# Patient Record
Sex: Female | Born: 1970 | Race: White | Hispanic: No | Marital: Married | Smoking: Never smoker
Health system: Southern US, Community
[De-identification: ages and names within clinical notes are randomized; demographics above are authoritative.]

## PROBLEM LIST (undated history)

## (undated) DIAGNOSIS — N390 Urinary tract infection, site not specified: Secondary | ICD-10-CM

## (undated) DIAGNOSIS — F410 Panic disorder [episodic paroxysmal anxiety] without agoraphobia: Secondary | ICD-10-CM

## (undated) DIAGNOSIS — B019 Varicella without complication: Secondary | ICD-10-CM

## (undated) DIAGNOSIS — K5900 Constipation, unspecified: Secondary | ICD-10-CM

## (undated) DIAGNOSIS — J309 Allergic rhinitis, unspecified: Secondary | ICD-10-CM

## (undated) DIAGNOSIS — N200 Calculus of kidney: Secondary | ICD-10-CM

## (undated) HISTORY — PX: LITHOTRIPSY: SUR834

## (undated) HISTORY — DX: Calculus of kidney: N20.0

## (undated) HISTORY — DX: Varicella without complication: B01.9

## (undated) HISTORY — DX: Panic disorder (episodic paroxysmal anxiety): F41.0

## (undated) HISTORY — DX: Prediabetes: R73.03

## (undated) HISTORY — DX: Urinary tract infection, site not specified: N39.0

## (undated) HISTORY — DX: Constipation, unspecified: K59.00

## (undated) HISTORY — DX: Allergic rhinitis, unspecified: J30.9

---

## 1994-09-20 HISTORY — PX: CHOLECYSTECTOMY: SHX55

## 2014-05-09 ENCOUNTER — Other Ambulatory Visit: Payer: Self-pay | Admitting: Obstetrics and Gynecology

## 2014-05-09 DIAGNOSIS — R928 Other abnormal and inconclusive findings on diagnostic imaging of breast: Secondary | ICD-10-CM

## 2016-02-24 ENCOUNTER — Other Ambulatory Visit: Payer: Self-pay | Admitting: Family Medicine

## 2016-02-24 ENCOUNTER — Ambulatory Visit
Admission: RE | Admit: 2016-02-24 | Discharge: 2016-02-24 | Disposition: A | Discharge status: Home / Self Care | Payer: Self-pay | Source: Ambulatory Visit | Attending: Family Medicine | Admitting: Family Medicine

## 2016-02-24 DIAGNOSIS — R109 Unspecified abdominal pain: Secondary | ICD-10-CM

## 2016-02-24 DIAGNOSIS — R10A0 Flank pain, unspecified side: Secondary | ICD-10-CM

## 2016-06-30 ENCOUNTER — Other Ambulatory Visit: Payer: Self-pay | Admitting: Orthopedic Surgery

## 2016-06-30 DIAGNOSIS — M542 Cervicalgia: Secondary | ICD-10-CM

## 2016-07-10 ENCOUNTER — Ambulatory Visit
Admission: RE | Admit: 2016-07-10 | Discharge: 2016-07-10 | Disposition: A | Discharge status: Home / Self Care | Payer: Managed Care, Other (non HMO) | Source: Ambulatory Visit | Attending: Orthopedic Surgery | Admitting: Orthopedic Surgery

## 2016-07-10 DIAGNOSIS — M542 Cervicalgia: Secondary | ICD-10-CM

## 2016-07-28 ENCOUNTER — Other Ambulatory Visit: Payer: Self-pay | Admitting: Neurosurgery

## 2016-07-28 DIAGNOSIS — M5412 Radiculopathy, cervical region: Secondary | ICD-10-CM

## 2016-08-06 ENCOUNTER — Ambulatory Visit
Admission: RE | Admit: 2016-08-06 | Discharge: 2016-08-06 | Disposition: A | Discharge status: Home / Self Care | Payer: Managed Care, Other (non HMO) | Source: Ambulatory Visit | Attending: Neurosurgery | Admitting: Neurosurgery

## 2016-08-06 ENCOUNTER — Encounter: Payer: Self-pay | Admitting: Radiology

## 2016-08-06 DIAGNOSIS — M5412 Radiculopathy, cervical region: Secondary | ICD-10-CM

## 2016-08-06 MED ORDER — IOPAMIDOL (ISOVUE-M 300) INJECTION 61%
10.0000 mL | Freq: Once | INTRAMUSCULAR | Status: AC | PRN
  Administered 2016-08-06: 10 mL via INTRATHECAL

## 2016-08-06 MED ORDER — DIPHENHYDRAMINE HCL 50 MG PO CAPS
50.0000 mg | ORAL_CAPSULE | Freq: Once | ORAL | Status: AC
  Administered 2016-08-06: 50 mg via ORAL

## 2016-08-06 MED ORDER — ONDANSETRON HCL 4 MG/2ML IJ SOLN
4.0000 mg | Freq: Once | INTRAMUSCULAR | Status: AC
  Administered 2016-08-06: 4 mg via INTRAMUSCULAR

## 2016-08-06 MED ORDER — ONDANSETRON HCL 4 MG/2ML IJ SOLN: 4.0000 mg | Freq: Four times a day (QID) | INTRAMUSCULAR | Status: DC | PRN

## 2016-08-06 MED ORDER — MEPERIDINE HCL 100 MG/ML IJ SOLN
75.0000 mg | Freq: Once | INTRAMUSCULAR | Status: AC
  Administered 2016-08-06: 75 mg via INTRAMUSCULAR

## 2016-08-06 MED ORDER — DIAZEPAM 5 MG PO TABS
10.0000 mg | ORAL_TABLET | Freq: Once | ORAL | Status: AC
  Administered 2016-08-06: 10 mg via ORAL

## 2016-08-06 NOTE — Progress Notes (Signed)
Patient states she has been off Tramadol for at least the past two days.  jkl 

## 2016-08-06 NOTE — Discharge Instructions (Signed)
Myelogram Discharge Instructions  1. Go home and rest quietly for the next 24 hours.  It is important to lie flat for the next 24 hours.  Get up only to go to the restroom.  You may lie in the bed or on a couch on your back, your stomach, your left side or your right side.  You may have one pillow under your head.  You may have pillows between your knees while you are on your side or under your knees while you are on your back.  2. DO NOT drive today.  Recline the seat as far back as it will go, while still wearing your seat belt, on the way home.  3. You may get up to go to the bathroom as needed.  You may sit up for 10 minutes to eat.  You may resume your normal diet and medications unless otherwise indicated.  Drink plenty of extra fluids today and tomorrow.  4. The incidence of a spinal headache with nausea and/or vomiting is about 5% (one in 20 patients).  If you develop a headache, lie flat and drink plenty of fluids until the headache goes away.  Caffeinated beverages may be helpful.  If you develop severe nausea and vomiting or a headache that does not go away with flat bed rest, call [phone].  5. You may resume normal activities after your 24 hours of bed rest is over; however, do not exert yourself strongly or do any heavy lifting tomorrow.  6. Call your physician for a follow-up appointment.   You may resume Tramadol on Saturday,  August 07, 2016 after 10:30a.m.

## 2016-08-09 ENCOUNTER — Telehealth: Payer: Self-pay | Admitting: Radiology

## 2016-08-09 NOTE — Telephone Encounter (Signed)
Pt has flare up of her pain. Explained this was a side effect of the myelogram. Denies headache and will f/u with Dr. Jordan LikesPool in the am.

## 2016-08-18 ENCOUNTER — Other Ambulatory Visit (HOSPITAL_COMMUNITY): Payer: Self-pay | Admitting: Neurosurgery

## 2016-08-18 DIAGNOSIS — M5412 Radiculopathy, cervical region: Secondary | ICD-10-CM

## 2016-08-24 ENCOUNTER — Encounter (HOSPITAL_COMMUNITY)
Admission: RE | Admit: 2016-08-24 | Discharge: 2016-08-24 | Disposition: A | Discharge status: Home / Self Care | Payer: Managed Care, Other (non HMO) | Source: Ambulatory Visit | Attending: Neurosurgery | Admitting: Neurosurgery

## 2016-08-24 DIAGNOSIS — M5412 Radiculopathy, cervical region: Secondary | ICD-10-CM | POA: Insufficient documentation

## 2016-08-24 MED ORDER — TECHNETIUM TC 99M MEDRONATE IV KIT
21.2000 | PACK | Freq: Once | INTRAVENOUS | Status: AC | PRN
  Administered 2016-08-24: 21.2 via INTRAVENOUS

## 2018-05-17 IMAGING — CT CT CERVICAL SPINE W/ CM
2 series · 10 of 14 positions shown, 12 images · non-contrast
Comparison: Cervical spine MRI 07/10/2016

CLINICAL DATA: Neck and left upper extremity pain.
TECHNIQUE: Contiguous axial images were obtained through the Cervical spine
after the intrathecal infusion of infusion. Coronal and sagittal
reconstructions were obtained of the axial image sets.

[Series 2: cspine soft · axial · 0.33mm/px · z∈[-635,-507]mm · 5 of 97 slices shown]
[im 17/97  soft-tissue]
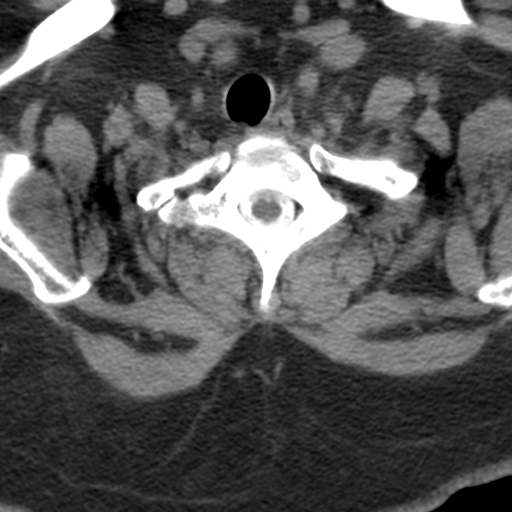
[im 33/97  soft-tissue]
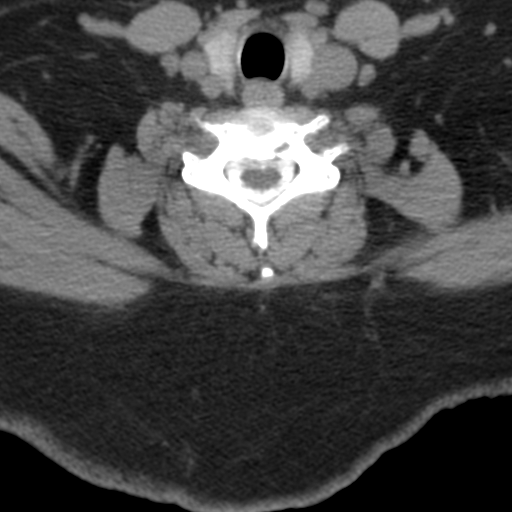
[im 49/97  soft-tissue]
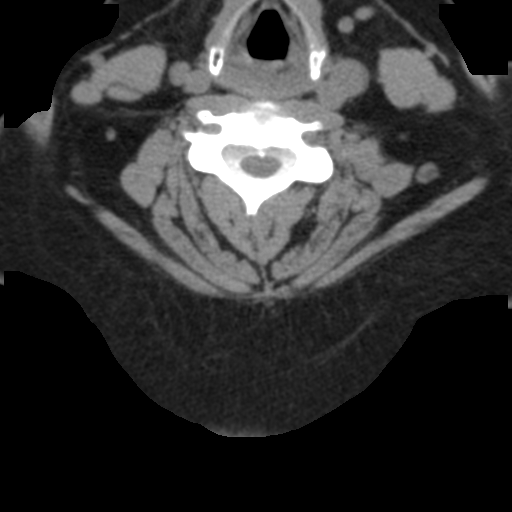
[im 65/97  soft-tissue]
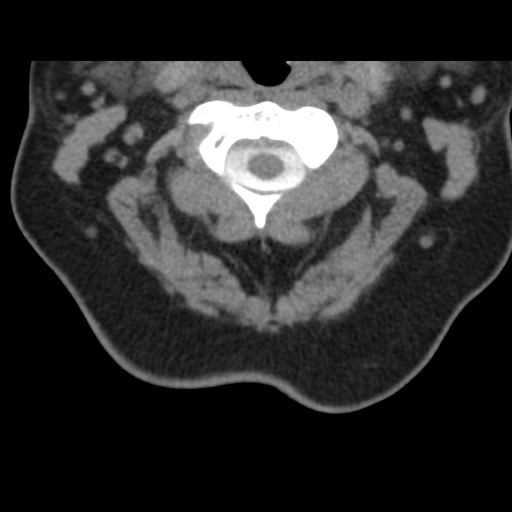
[im 81/97  soft-tissue]
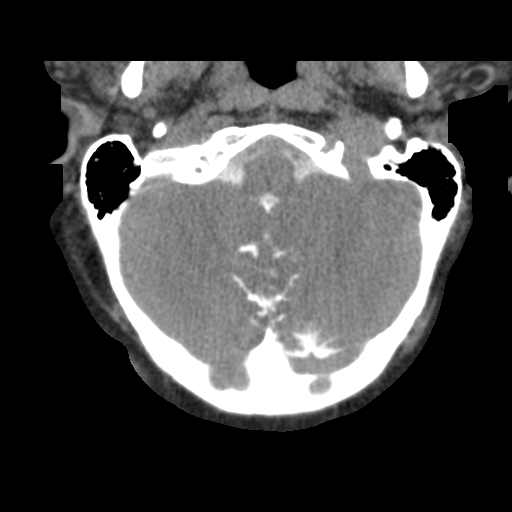

[Series 8: angled axial · axial · 0.29mm/px · z∈[-651,-527]mm · 5 of 97 slices shown, 7 images]
[im 17/97  soft-tissue]
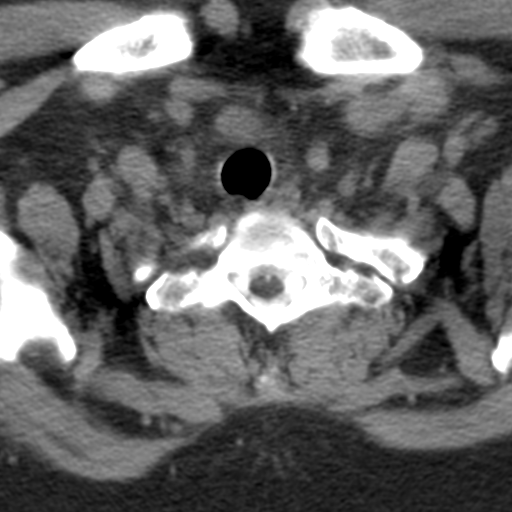
[im 17/97  bone]
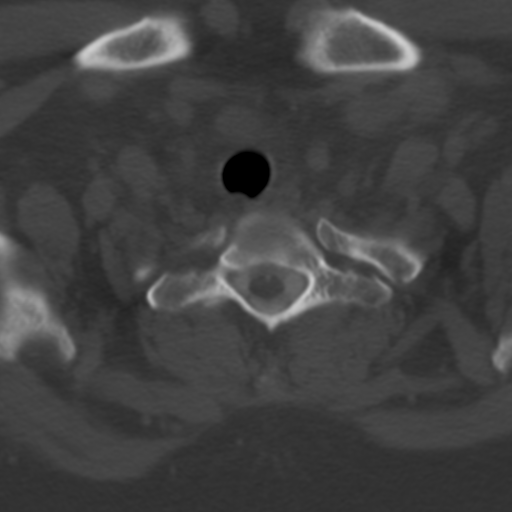
[im 33/97  bone]
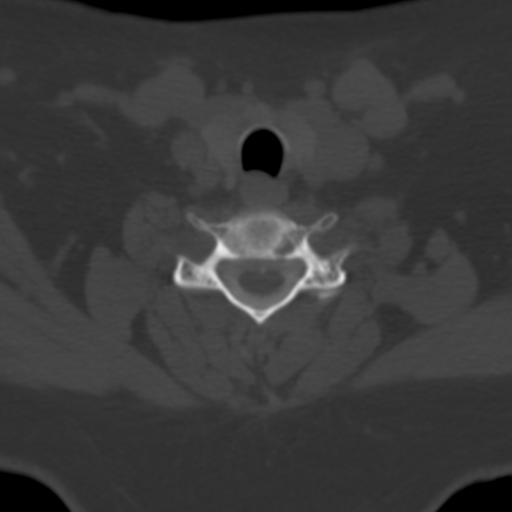
[im 49/97  bone]
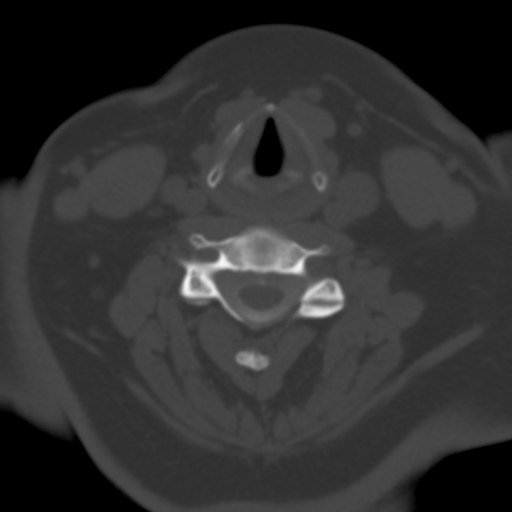
[im 65/97  bone]
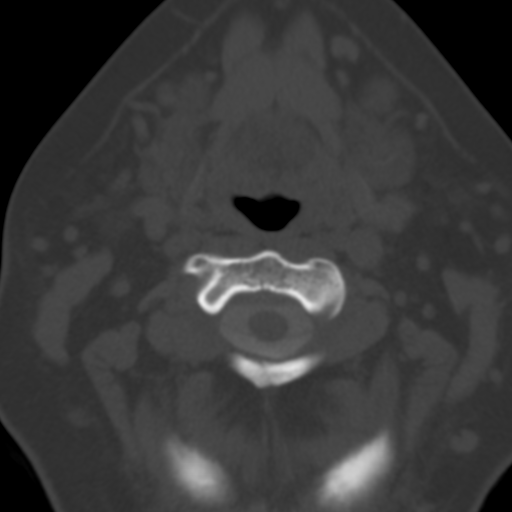
[im 81/97  soft-tissue]
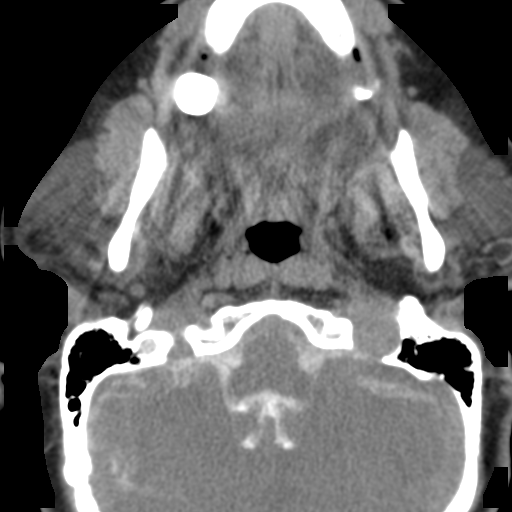
[im 81/97  bone]
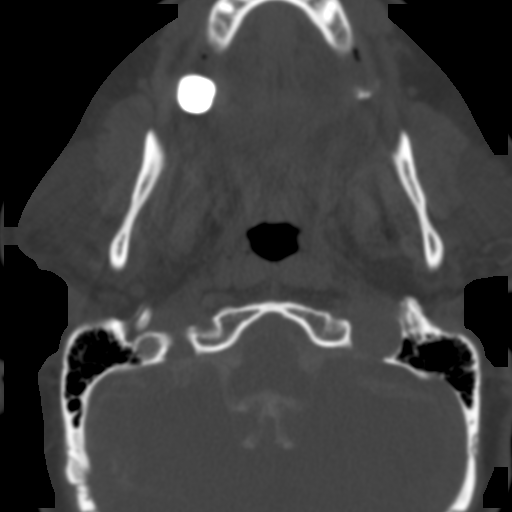

[10 of 14 positions shown; findings below may reference images not displayed]

FLUOROSCOPY TIME:  Radiation Exposure Index (as provided by the
fluoroscopic device): 233.05 microGray*m^2

Fluoroscopy Time (in minutes and seconds):  40 seconds

PROCEDURE:
LUMBAR PUNCTURE FOR CERVICAL MYELOGRAM

After thorough discussion of risks and benefits of the procedure
including bleeding, infection, injury to nerves, blood vessels,
adjacent structures as well as headache and CSF leak, written and
oral informed consent was obtained. Consent was obtained by Dr.
Loela Alazzawi. We discussed the high likelihood of obtaining a
diagnostic study.

Patient was positioned prone on the fluoroscopy table. Local
anesthesia was provided with 1% lidocaine without epinephrine after
prepped and draped in the usual sterile fashion. Puncture was
performed at L3-4 using a 5 inch 22-gauge spinal needle via a left
interlaminar approach. Using a single pass through the dura, the
needle was placed within the thecal sac, with return of clear CSF.
10 mL of Isovue M 300 was injected into the thecal sac, with normal
opacification of the nerve roots and cauda equina consistent with
free flow within the subarachnoid space. The patient was then moved
to the trendelenburg position and contrast flowed into the Cervical
spine region.

I personally performed the lumbar puncture and administered the
intrathecal contrast. I also personally supervised acquisition of
the myelogram images.
FINDINGS: CERVICAL MYELOGRAM FINDINGS:

Vertebral alignment is normal. There is no evidence of dynamic
instability on upright flexion or extension images. The thecal sac
appears widely patent, and no asymmetric nerve root effacement is
seen.

CT CERVICAL MYELOGRAM FINDINGS:

Vertebral alignment is normal. There is no evidence of fracture.
There is a sharply marginated 8 mm lucent focus in the posterior C6
vertebral body just left of midline which is overall benign in
appearance, possibly venous. There is also a somewhat trabeculated,
lucent appearance of the left C5 and C6 articular pillars without
cortical destruction, facet joint space narrowing, or degenerative
spurring. The cervical spinal cord is normal in caliber. The
paraspinal soft tissues are unremarkable.

Intervertebral disc space heights are preserved throughout the
cervical spine. No disc herniation is identified. The spinal canal
and neural foramina are widely patent.
IMPRESSION: 1. No cause of neck pain or cervical radiculopathy identified. No
disc herniation or stenosis.
2. Small lucencies in the C6 vertebral body and left C5 and C6
articular pillars, indeterminate but overall benign in appearance,
possibly vascular. No fracture.

## 2018-08-11 ENCOUNTER — Ambulatory Visit: Payer: Managed Care, Other (non HMO) | Admitting: Primary Care

## 2018-08-11 ENCOUNTER — Encounter (INDEPENDENT_AMBULATORY_CARE_PROVIDER_SITE_OTHER): Payer: Self-pay

## 2018-08-11 ENCOUNTER — Telehealth: Payer: Self-pay | Admitting: Primary Care

## 2018-08-11 ENCOUNTER — Encounter: Payer: Self-pay | Admitting: Primary Care

## 2018-08-11 VITALS — BP 124/80 | HR 87 | Temp 98.3°F | Ht 63.5 in | Wt 203.5 lb

## 2018-08-11 DIAGNOSIS — M5442 Lumbago with sciatica, left side: Principal | ICD-10-CM

## 2018-08-11 DIAGNOSIS — M549 Dorsalgia, unspecified: Secondary | ICD-10-CM

## 2018-08-11 DIAGNOSIS — E669 Obesity, unspecified: Secondary | ICD-10-CM | POA: Insufficient documentation

## 2018-08-11 DIAGNOSIS — E66813 Obesity, class 3: Secondary | ICD-10-CM | POA: Insufficient documentation

## 2018-08-11 DIAGNOSIS — G8929 Other chronic pain: Secondary | ICD-10-CM | POA: Insufficient documentation

## 2018-08-11 NOTE — Progress Notes (Signed)
Subjective:    Patient ID: Jill Meyers, female    DOB: November 10, 1970, 47 y.o.   MRN: [id number]  HPI  Jill Meyers is a 47 year old female who presents today to establish care and discuss the problems mentioned below. Will obtain old records. Her last physical was in Summer 2018, mammogram was in August 2019.  She underwent some lab work for a biometric screening at her occupation in October.  She is not sure of the results.  1) Left Upper and Lower Extremity Pain: Occurred to the left upper extremity several years ago after a manipulation to the neck from a chiropractor. She followed with neurosurgery who treated her with medication and physical therapy. Since then her symptoms of upper extremity pain have resolved.  She then noticed left lower extremity pain with radiation down to her mid lateral thigh that has been present for the last 8 month, also with numbness. She does have intermittent lower back pain. She was told by a chiropractor that her vertebra in the lower spine was compressed which is pinching a nerve. She has since been stretching, doing yoga, wearing more comfortable shoes without much improvement.  She's not seen a physical therapist as she cannot afford the co-pay. She's never undergone steroid injections to her lumbar spine.  She does have contact with an orthopedist who is treating her husband.  She plans on calling to schedule an appointment.   Review of Systems  Respiratory: Negative for shortness of breath.   Cardiovascular: Negative for chest pain.  Genitourinary:       Denies changes in bowel or bladder control.  Musculoskeletal: Positive for back pain.       Left lower extremity pain  Skin: Negative for color change.  Neurological: Positive for numbness. Negative for weakness.       Past Medical History:  Diagnosis Date  . Chickenpox   . Kidney stones   . UTI (urinary tract infection)      Social History   Socioeconomic History  . Marital status:  Married    Spouse name: Not on file  . Number of children: Not on file  . Years of education: Not on file  . Highest education level: Not on file  Occupational History  . Not on file  Social Needs  . Financial resource strain: Not on file  . Food insecurity:    Worry: Not on file    Inability: Not on file  . Transportation needs:    Medical: Not on file    Non-medical: Not on file  Tobacco Use  . Smoking status: Never Smoker  . Smokeless tobacco: Never Used  Substance and Sexual Activity  . Alcohol use: Not Currently  . Drug use: Not on file  . Sexual activity: Not on file  Lifestyle  . Physical activity:    Days per week: Not on file    Minutes per session: Not on file  . Stress: Not on file  Relationships  . Social connections:    Talks on phone: Not on file    Gets together: Not on file    Attends religious service: Not on file    Active member of club or organization: Not on file    Attends meetings of clubs or organizations: Not on file    Relationship status: Not on file  . Intimate partner violence:    Fear of current or ex partner: Not on file    Emotionally abused: Not on file  Physically abused: Not on file    Forced sexual activity: Not on file  Other Topics Concern  . Not on file  Social History Narrative   Married.   Works as a Scientist, clinical (histocompatibility and immunogenetics).     Past Surgical History:  Procedure Laterality Date  . CHOLECYSTECTOMY  1996  . LITHOTRIPSY      Family History  Problem Relation Age of Onset  . Arthritis Mother   . Hyperlipidemia Mother   . Breast cancer Mother   . Hyperlipidemia Father   . Hypertension Father   . Stroke Father   . Throat cancer Father   . Alcohol abuse Father   . Arthritis Maternal Grandmother   . Hearing loss Maternal Grandmother   . Arthritis Maternal Grandfather   . Heart disease Maternal Grandfather   . Hypertension Maternal Grandfather     No Known Allergies  Current Outpatient Medications on File Prior to  Visit  Medication Sig Dispense Refill  . phentermine 37.5 MG capsule Take 18.75 mg by mouth every morning.     No current facility-administered medications on file prior to visit.     BP 124/80   Pulse 87   Temp 98.3 F (36.8 C) (Oral)   Ht 5' 3.5" (1.613 m)   Wt 203 lb 8 oz (92.3 kg)   LMP 07/19/2018   SpO2 98%   BMI 35.48 kg/m    Objective:   Physical Exam  Constitutional: She appears well-nourished.  Neck: Neck supple.  Cardiovascular: Normal rate and regular rhythm.  Respiratory: Effort normal and breath sounds normal.  Musculoskeletal:  5 out of 5 strength to bilateral lower extremities.  Negative straight leg raise.  Skin: Skin is warm and dry.  Psychiatric: She has a normal mood and affect.           Assessment & Plan:

## 2018-08-11 NOTE — Assessment & Plan Note (Signed)
Chronic over the last several years.  Temporary improvement with home stretching, yoga, heat.  Recommended physical therapy for which she cannot afford.  She did undergo plain films of the lumbar spine 3 to 4 months ago by a chiropractor.  We will get those records.  She plans on scheduling appointment with an orthopedist for further evaluation.  Agree, she may respond with steroid injections.  No alarm signs or abnormalities on exam.

## 2018-08-11 NOTE — Telephone Encounter (Signed)
Pt will be going to [facility] Laurier Nancy[facility]by Robbins [facility]  and will need a referral.  pt's appt  Is scheduled for  12.13.19. Thanks

## 2018-08-11 NOTE — Telephone Encounter (Signed)
Noted  Referral placed.

## 2018-08-11 NOTE — Assessment & Plan Note (Signed)
Prescribed phentermine by a local weight loss clinic.  She takes this intermittently.

## 2018-08-11 NOTE — Patient Instructions (Addendum)
Please provide me with a copy of your lab reports if possible.  Contact the orthopedist for further evaluation of your back and lower extremity. Let me know if you need a referral.   Please schedule a physical with me anytime at your convenience. You may also schedule a lab only appointment 3-4 days prior. We will discuss your lab results in detail during your physical.  It was a pleasure to meet you today! Please don't hesitate to call or message me with any questions. Welcome to [facility]!

## 2018-08-15 ENCOUNTER — Telehealth: Payer: Self-pay | Admitting: Primary Care

## 2018-08-15 ENCOUNTER — Encounter: Payer: Self-pay | Admitting: Primary Care

## 2018-08-15 NOTE — Telephone Encounter (Signed)
[facility]. I requested the xray of her lumbar spine which the patient endorses was 3- 4 months ago. Do they have this? Phone number is [phone].

## 2018-08-16 NOTE — Telephone Encounter (Signed)
Called the office on 08/15/2018 but was told that the person who handle this is already out of the office. I called again today 08/16/2018 and was told that the office is closed.

## 2018-08-22 NOTE — Telephone Encounter (Signed)
Tried to call office and must be lunch time. Will try again later.

## 2018-08-23 NOTE — Telephone Encounter (Signed)
Called office again. I was told that they can only send a CD of the x-ray and cannot print out. She can mail a CD to our office if provider wants.

## 2018-08-23 NOTE — Telephone Encounter (Signed)
We have no way of pulling up images with a CD. She will be seeing orthopedics soon.

## 2018-11-22 ENCOUNTER — Telehealth: Payer: Self-pay

## 2018-11-22 NOTE — Telephone Encounter (Signed)
Noted and will evaluate.  

## 2018-11-22 NOTE — Telephone Encounter (Signed)
Pt fell on 11/21/18 and hit head against door frame; pt did not lose consciousness. Pt had huge goose egg over lt eye; applied ice and goose egg went down; today still tender to touch over lt eye; tight feeling around lt eye; slight burning sensation inside eye.No confusion, problems focusing, N&V, speech issues or H/A. Pt thinks she is OK but has been reading on line and would like Allayne Gitelman NP to ck her out. Pt scheduled appt on 11/23/18 at 8:40. If pt condition changes or worsens prior to appt pt will go to [facility] or ED. FYI to Allayne Gitelman NP.

## 2018-11-23 ENCOUNTER — Ambulatory Visit: Payer: Managed Care, Other (non HMO) | Admitting: Primary Care

## 2018-11-28 ENCOUNTER — Ambulatory Visit: Payer: Managed Care, Other (non HMO) | Admitting: Primary Care

## 2018-11-28 ENCOUNTER — Encounter: Payer: Self-pay | Admitting: Primary Care

## 2018-11-28 VITALS — BP 126/86 | HR 93 | Temp 98.5°F | Ht 63.5 in | Wt 209.5 lb

## 2018-11-28 DIAGNOSIS — R350 Frequency of micturition: Secondary | ICD-10-CM | POA: Diagnosis not present

## 2018-11-28 DIAGNOSIS — Z0000 Encounter for general adult medical examination without abnormal findings: Secondary | ICD-10-CM | POA: Diagnosis not present

## 2018-11-28 LAB — POC URINALSYSI DIPSTICK (AUTOMATED)
BILIRUBIN UA: NEGATIVE
Blood, UA: NEGATIVE
GLUCOSE UA: NEGATIVE
KETONES UA: NEGATIVE
LEUKOCYTES UA: NEGATIVE
Nitrite, UA: NEGATIVE
PROTEIN UA: NEGATIVE
SPEC GRAV UA: 1.025 (ref 1.010–1.025)
Urobilinogen, UA: 0.2 E.U./dL
pH, UA: 5.5 (ref 5.0–8.0)

## 2018-11-28 NOTE — Patient Instructions (Signed)
Stop by the lab prior to leaving today. I will notify you of your results once received.   We will be in touch regarding your urine culture results in a few days.  Please schedule a physical with me for later this month as discussed.  It was a pleasure to see you today!

## 2018-11-28 NOTE — Addendum Note (Signed)
Addended by: Tawnya Crook on: 11/28/2018 03:24 PM   Modules accepted: Orders

## 2018-11-28 NOTE — Progress Notes (Signed)
Subjective:    Patient ID: Liddy Seitz, female    DOB: [birth year], 48 y.o.   MRN: [id number]  HPI  Ms. Biese is a 48 year old female with a history of chronic back pain who presents today with a chief complaint of urinary frequency.  She also reports right groin pain, foul smelling urine. She's been drinking a lot water and cranberry juice and taking Tylenol without much improvement. Symptoms began three nights ago with urinary frequency and right groin pain. She also started to notice left flank pain.   She denies fevers, hematuria, diarrhea, nausea, vaginal discharge, vaginal itching. Overall today she's feeling better as most symptoms have improved.   Review of Systems  Constitutional: Negative for fever.  Gastrointestinal: Negative for nausea.  Genitourinary: Positive for flank pain and frequency. Negative for dysuria and vaginal discharge.       Past Medical History:  Diagnosis Date  . Allergic rhinitis   . Chickenpox   . Constipation   . Kidney stones   . Panic attacks   . Prediabetes   . UTI (urinary tract infection)      Social History   Socioeconomic History  . Marital status: Married    Spouse name: Not on file  . Number of children: Not on file  . Years of education: Not on file  . Highest education level: Not on file  Occupational History  . Not on file  Social Needs  . Financial resource strain: Not on file  . Food insecurity:    Worry: Not on file    Inability: Not on file  . Transportation needs:    Medical: Not on file    Non-medical: Not on file  Tobacco Use  . Smoking status: Never Smoker  . Smokeless tobacco: Never Used  Substance and Sexual Activity  . Alcohol use: Not Currently  . Drug use: Not on file  . Sexual activity: Not on file  Lifestyle  . Physical activity:    Days per week: Not on file    Minutes per session: Not on file  . Stress: Not on file  Relationships  . Social connections:    Talks on phone: Not on file   Gets together: Not on file    Attends religious service: Not on file    Active member of club or organization: Not on file    Attends meetings of clubs or organizations: Not on file    Relationship status: Not on file  . Intimate partner violence:    Fear of current or ex partner: Not on file    Emotionally abused: Not on file    Physically abused: Not on file    Forced sexual activity: Not on file  Other Topics Concern  . Not on file  Social History Narrative   Married.   Works as a Scientist, clinical (histocompatibility and immunogenetics).     Past Surgical History:  Procedure Laterality Date  . CHOLECYSTECTOMY  1996  . LITHOTRIPSY      Family History  Problem Relation Age of Onset  . Arthritis Mother   . Hyperlipidemia Mother   . Breast cancer Mother   . Hyperlipidemia Father   . Hypertension Father   . Stroke Father   . Throat cancer Father   . Alcohol abuse Father   . Arthritis Maternal Grandmother   . Hearing loss Maternal Grandmother   . Arthritis Maternal Grandfather   . Heart disease Maternal Grandfather   . Hypertension Maternal Grandfather  Allergies  Allergen Reactions  . Prednisone Swelling    Facial swelling  . Hydrocodone Itching    Any combination drug containing hydrocodone also makes patient itch. She states she still takes this medication but takes Benadryl along with it. Any combination drug containing hydrocodone also makes patient itch  . Vicodin [Hydrocodone-Acetaminophen] Itching  . Ciprofloxacin Other (See Comments)    Unknown, found from records  . Tramadol Nausea Only    Current Outpatient Medications on File Prior to Visit  Medication Sig Dispense Refill  . phentermine 37.5 MG capsule Take 18.75 mg by mouth every morning.     No current facility-administered medications on file prior to visit.     BP 126/86   Pulse 93   Temp 98.5 F (36.9 C) (Oral)   Ht 5' 3.5" (1.613 m)   Wt 209 lb 8 oz (95 kg)   LMP 11/11/2018   SpO2 99%   BMI 36.53 kg/m    Objective:    Physical Exam  Constitutional: She appears well-nourished.  Neck: Neck supple.  Cardiovascular: Normal rate and regular rhythm.  Respiratory: Effort normal and breath sounds normal.  GI: Soft. Normal appearance and bowel sounds are normal. There is no CVA tenderness.  Skin: Skin is warm and dry.           Assessment & Plan:  Urinary Frequency:  Chronic urinary frequency, increased over the last 3 days with other symptoms.  Exam today unremarkable. UA negative. Culture sent. Discussed differential diagnosis including interstitial cystitis, diabetes. We will be seeing her back in a few weeks for her CPE. Check A1C today.  Doreene Nest, NP

## 2018-11-29 LAB — COMPREHENSIVE METABOLIC PANEL
ALBUMIN: 4.6 g/dL (ref 3.5–5.2)
ALT: 15 U/L (ref 0–35)
AST: 17 U/L (ref 0–37)
Alkaline Phosphatase: 50 U/L (ref 39–117)
BILIRUBIN TOTAL: 0.3 mg/dL (ref 0.2–1.2)
BUN: 11 mg/dL (ref 6–23)
CALCIUM: 9.4 mg/dL (ref 8.4–10.5)
CO2: 28 meq/L (ref 19–32)
CREATININE: 0.56 mg/dL (ref 0.40–1.20)
Chloride: 101 mEq/L (ref 96–112)
GFR: 115.89 mL/min (ref >60.00)
Glucose, Bld: 87 mg/dL (ref 70–99)
Potassium: 4.4 mEq/L (ref 3.5–5.1)
SODIUM: 135 meq/L (ref 135–145)
Total Protein: 7.3 g/dL (ref 6.0–8.3)

## 2018-11-29 LAB — HEMOGLOBIN A1C: Hgb A1c MFr Bld: 5.6 % (ref 4.6–6.5)

## 2018-11-29 LAB — CBC
HCT: 38.9 % (ref 36.0–46.0)
Hemoglobin: 13 g/dL (ref 12.0–15.0)
MCHC: 33.5 g/dL (ref 30.0–36.0)
MCV: 91.7 fl (ref 78.0–100.0)
PLATELETS: 322 10*3/uL (ref 150.0–400.0)
RBC: 4.24 Mil/uL (ref 3.87–5.11)
RDW: 13.6 % (ref 11.5–15.5)
WBC: 7.6 10*3/uL (ref 4.0–10.5)

## 2018-11-29 LAB — LIPID PANEL
CHOL/HDL RATIO: 3
Cholesterol: 168 mg/dL (ref 0–200)
HDL: 52 mg/dL (ref >39.00)
LDL Cholesterol: 78 mg/dL (ref 0–99)
NONHDL: 115.56
Triglycerides: 187 mg/dL — ABNORMAL HIGH (ref 0.0–149.0)
VLDL: 37.4 mg/dL (ref 0.0–40.0)

## 2018-11-29 LAB — TSH: TSH: 1.36 u[IU]/mL (ref 0.35–4.50)

## 2018-11-30 LAB — URINE CULTURE
MICRO NUMBER:: 299973
SPECIMEN QUALITY: ADEQUATE

## 2018-12-15 ENCOUNTER — Encounter: Payer: Managed Care, Other (non HMO) | Admitting: Primary Care

## 2019-07-11 ENCOUNTER — Other Ambulatory Visit: Payer: Self-pay

## 2019-07-11 ENCOUNTER — Encounter: Payer: Self-pay | Admitting: Primary Care

## 2019-07-11 ENCOUNTER — Other Ambulatory Visit: Payer: Self-pay | Admitting: *Deleted

## 2019-07-11 ENCOUNTER — Ambulatory Visit: Payer: Managed Care, Other (non HMO) | Admitting: Primary Care

## 2019-07-11 DIAGNOSIS — Z20822 Contact with and (suspected) exposure to covid-19: Secondary | ICD-10-CM

## 2019-07-11 DIAGNOSIS — R0982 Postnasal drip: Secondary | ICD-10-CM | POA: Diagnosis not present

## 2019-07-11 NOTE — Patient Instructions (Addendum)
Start Claritin daily for potential allergies.  Nasal Congestion/Ear Pressure: Try using Flonase (fluticasone) nasal spray. Instill 1 spray in each nostril twice daily.   Continue Tylenol and Ibuprofen as needed for pain.  Go for the Covid testing as discussed.   It was a pleasure to see you today!

## 2019-07-11 NOTE — Assessment & Plan Note (Signed)
Symptoms representative of allergy involvement, especially after exam which shows evidence of effusion.   Given Covid-19 pandemic and her exposure to numerous people at work, we will also send her for testing.   Start Claritin and Flonase. Quarantine until [employer] results have been received.  She appears well, not sickly.

## 2019-07-11 NOTE — Progress Notes (Signed)
Subjective:    Patient ID: Jill Meyers, female    DOB: 03-11-1971, 48 y.o.   MRN: [id number]  HPI  Jill Meyers is a 48 year old female with a history of chronic back pain who presents today with a chief complaint of neck and otalgia.  Her neck pain is located to the posterior and left lateral neck, thinks she's noticed some swollen glands. Neck pain is worse with left rotation. Also reporting headaches, left ear pain, post nasal drip.   Symptoms began about five days ago. Initially she thought her neck was stiff as she has a history of "pinched nerve" to the neck with radiation down to her left upper extremity. She works in [employer] and is picking up and putting down books constantly throughout the day.  She denies fevers, sore throat, cough, body aches, rhinorrhea, congestion, radiculopathy to left upper extremity, decrease in ROM to left upper extremity or shoulder. She is exposed to numerous people throughout the day at [employer]. She's taken Tylenol and Ibuprofen with some improvement.   BP Readings from Last 3 Encounters:  07/11/19 124/82  11/28/18 126/86  08/11/18 124/80     Review of Systems  Constitutional: Negative for fever.  HENT: Positive for postnasal drip. Negative for congestion, rhinorrhea, sinus pressure and sore throat.   Respiratory: Negative for cough and shortness of breath.   Musculoskeletal: Positive for myalgias.       Left lateral neck pain  Neurological: Negative for dizziness, numbness and headaches.       Past Medical History:  Diagnosis Date  . Allergic rhinitis   . Chickenpox   . Constipation   . Kidney stones   . Panic attacks   . Prediabetes   . UTI (urinary tract infection)      Social History   Socioeconomic History  . Marital status: Married    Spouse name: Not on file  . Number of children: Not on file  . Years of education: Not on file  . Highest education level: Not on file  Occupational History  . Not on file  Social  Needs  . Financial resource strain: Not on file  . Food insecurity    Worry: Not on file    Inability: Not on file  . Transportation needs    Medical: Not on file    Non-medical: Not on file  Tobacco Use  . Smoking status: Never Smoker  . Smokeless tobacco: Never Used  Substance and Sexual Activity  . Alcohol use: Not Currently  . Drug use: Not on file  . Sexual activity: Not on file  Lifestyle  . Physical activity    Days per week: Not on file    Minutes per session: Not on file  . Stress: Not on file  Relationships  . Social Herbalist on phone: Not on file    Gets together: Not on file    Attends religious service: Not on file    Active member of club or organization: Not on file    Attends meetings of clubs or organizations: Not on file    Relationship status: Not on file  . Intimate partner violence    Fear of current or ex partner: Not on file    Emotionally abused: Not on file    Physically abused: Not on file    Forced sexual activity: Not on file  Other Topics Concern  . Not on file  Social History Narrative   Married.  Works as a Scientist, clinical (histocompatibility and immunogenetics).     Past Surgical History:  Procedure Laterality Date  . CHOLECYSTECTOMY  1996  . LITHOTRIPSY      Family History  Problem Relation Age of Onset  . Arthritis Mother   . Hyperlipidemia Mother   . Breast cancer Mother   . Hyperlipidemia Father   . Hypertension Father   . Stroke Father   . Throat cancer Father   . Alcohol abuse Father   . Arthritis Maternal Grandmother   . Hearing loss Maternal Grandmother   . Arthritis Maternal Grandfather   . Heart disease Maternal Grandfather   . Hypertension Maternal Grandfather     Allergies  Allergen Reactions  . Prednisone Swelling    Facial swelling  . Hydrocodone Itching    Any combination drug containing hydrocodone also makes patient itch. She states she still takes this medication but takes Benadryl along with it. Any combination drug  containing hydrocodone also makes patient itch  . Vicodin [Hydrocodone-Acetaminophen] Itching  . Ciprofloxacin Other (See Comments)    Unknown, found from records  . Tramadol Nausea Only    Current Outpatient Medications on File Prior to Visit  Medication Sig Dispense Refill  . phentermine (ADIPEX-P) 37.5 MG tablet Take 37.5 mg by mouth daily before breakfast.     No current facility-administered medications on file prior to visit.     BP 124/82   Pulse 93   Temp 98.1 F (36.7 C) (Temporal)   Wt 216 lb (98 kg)   SpO2 97%   BMI 37.66 kg/m    Objective:   Physical Exam  Constitutional: She appears well-nourished. She does not appear ill.  HENT:  Right Ear: Tympanic membrane and ear Meyers normal.  Left Ear: Tympanic membrane and ear Meyers normal.  Mouth/Throat: Oropharynx is clear and moist. No oropharyngeal exudate, posterior oropharyngeal edema or posterior oropharyngeal erythema.  Evidence of post nasal drip  Neck: Neck supple.  Cardiovascular: Normal rate and regular rhythm.  Respiratory: Effort normal and breath sounds normal. She has no wheezes.  Skin: Skin is warm and dry.           Assessment & Plan:

## 2019-07-12 LAB — NOVEL CORONAVIRUS, NAA: SARS-CoV-2, NAA: NOT DETECTED

## 2019-10-16 ENCOUNTER — Ambulatory Visit (INDEPENDENT_AMBULATORY_CARE_PROVIDER_SITE_OTHER)
Admission: RE | Admit: 2019-10-16 | Discharge: 2019-10-16 | Disposition: A | Discharge status: Home / Self Care | Payer: Managed Care, Other (non HMO) | Source: Ambulatory Visit | Attending: Primary Care | Admitting: Primary Care

## 2019-10-16 ENCOUNTER — Ambulatory Visit: Payer: Managed Care, Other (non HMO) | Admitting: Primary Care

## 2019-10-16 ENCOUNTER — Other Ambulatory Visit: Payer: Self-pay

## 2019-10-16 ENCOUNTER — Encounter: Payer: Self-pay | Admitting: Primary Care

## 2019-10-16 VITALS — BP 124/80 | HR 78 | Temp 97.7°F | Ht 63.5 in | Wt 220.5 lb

## 2019-10-16 DIAGNOSIS — M545 Low back pain, unspecified: Secondary | ICD-10-CM | POA: Insufficient documentation

## 2019-10-16 DIAGNOSIS — R109 Unspecified abdominal pain: Secondary | ICD-10-CM | POA: Insufficient documentation

## 2019-10-16 LAB — POC URINALSYSI DIPSTICK (AUTOMATED)
Bilirubin, UA: NEGATIVE
Glucose, UA: NEGATIVE
Ketones, UA: NEGATIVE
Leukocytes, UA: NEGATIVE
Nitrite, UA: NEGATIVE
Protein, UA: NEGATIVE
Spec Grav, UA: 1.025 (ref 1.010–1.025)
Urobilinogen, UA: 0.2 E.U./dL
pH, UA: 6 (ref 5.0–8.0)

## 2019-10-16 MED ORDER — TIZANIDINE HCL 4 MG PO TABS: 4.0000 mg | ORAL_TABLET | Freq: Three times a day (TID) | ORAL | 0 refills | Status: DC | PRN

## 2019-10-16 NOTE — Assessment & Plan Note (Signed)
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 Suspect symptoms today are more MSK.  Check plain films of lumbar spine. UA today overall unremarkable.  Discussed use of Tylenol/Ibuprofen. Rx for Tizanidine sent to pharmacy. Stretching/heat/ice. She will update.

## 2019-10-16 NOTE — Patient Instructions (Signed)
Complete xray(s) prior to leaving today. I will notify you of your results once received.  Start rotating Ibuprofen and Tylenol as discussed.  You may take Tizanidine every 8 hours as needed for muscle spasm/tightness. This may cause drowsiness.   It was a pleasure to see you today!

## 2019-10-16 NOTE — Assessment & Plan Note (Signed)
Lower flank/upper part of lower back x 3 weeks. History of renal stones.  No CVA tenderness. UA today with 2+ blood but she did start menses today. Otherwise negative.  Check KUB today. Suspect more MSK etiology based off of HPI and presentation.   Discussed treatment with NSAID's/Tylenol. Rx for Tizanidine sent to pharmacy.  She will update.

## 2019-10-16 NOTE — Progress Notes (Signed)
Subjective:    Patient ID: Jill Meyers, female    DOB: 1970-12-17, 49 y.o.   MRN: [id number]  HPI  This visit occurred during the SARS-CoV-2 public health emergency.  Safety protocols were in place, including screening questions prior to the visit, additional usage of staff PPE, and extensive cleaning of exam room while observing appropriate contact time as indicated for disinfecting solutions.   Jill Meyers is a 49 year old female with a history of chronic back pain, acute cystitis, renal stones with prior lithotripsy who presents today with a chief complaint of flank/lower back pain.  Her pain is non radiating and located to the left lower flank/lower back that she first noticed three weeks ago with increased discomfort over the last week. She has discomfort to the left mid and lower back with right lateral bending. She denies hematuria (did start menstruation today), fever, abdominal pain, urinary frequency. She's tried using heating pads, sleeping on a firmer mattress without improvement. She has taken Tylenol intermittently with temporary improvement.   Menstrual cycles have been altered for several months, also with symptoms of hot flashes and mood swings. She was 15 days late for her menses, started bleeding today. She took a pregnancy test which was negative.   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   Review of Systems  Constitutional: Negative for fever.  Gastrointestinal: Negative for abdominal pain and nausea.  Genitourinary: Positive for flank pain. Negative for dysuria, frequency, hematuria and vaginal discharge.  Musculoskeletal: Positive for back pain.       Past Medical History:  Diagnosis Date  . Allergic rhinitis   . Chickenpox   . Constipation   . Kidney stones   . Panic attacks   . Prediabetes   . UTI (urinary tract infection)      Social History   Socioeconomic History   . Marital status: Married    Spouse name: Not on file  . Number of children: Not on file  . Years of education: Not on file  . Highest education level: Not on file  Occupational History  . Not on file  Tobacco Use  . Smoking status: Never Smoker  . Smokeless tobacco: Never Used  Substance and Sexual Activity  . Alcohol use: Not Currently  . Drug use: Not on file  . Sexual activity: Not on file  Other Topics Concern  . Not on file  Social History Narrative   Married.   Works as a Environmental manager.    Social Determinants of Health   Financial Resource Strain:   . Difficulty of Paying Living Expenses: Not on file  Food Insecurity:   . Worried About Charity fundraiser in the Last Year: Not on file  . Ran Out of Food in the Last Year: Not on file  Transportation Needs:   . Lack of Transportation (Medical): Not on file  . Lack of Transportation (Non-Medical): Not on file  Physical Activity:   . Days of Exercise per Week: Not on file  . Minutes of Exercise per Session: Not on file  Stress:   . Feeling of Stress : Not on file  Social Connections:   . Frequency of Communication with Friends and Family: Not on file  . Frequency of Social Gatherings with Friends and Family: Not on file  . Attends Religious Services: Not on file  . Active Member of Clubs or  Organizations: Not on file  . Attends Banker Meetings: Not on file  . Marital Status: Not on file  Intimate Partner Violence:   . Fear of Current or Ex-Partner: Not on file  . Emotionally Abused: Not on file  . Physically Abused: Not on file  . Sexually Abused: Not on file    Past Surgical History:  Procedure Laterality Date  . CHOLECYSTECTOMY  1996  . LITHOTRIPSY      Family History  Problem Relation Age of Onset  . Arthritis Mother   . Hyperlipidemia Mother   . Breast cancer Mother   . Hyperlipidemia Father   . Hypertension Father   . Stroke Father   . Throat cancer Father   . Alcohol abuse  Father   . Arthritis Maternal Grandmother   . Hearing loss Maternal Grandmother   . Arthritis Maternal Grandfather   . Heart disease Maternal Grandfather   . Hypertension Maternal Grandfather     Allergies  Allergen Reactions  . Prednisone Swelling    Facial swelling  . Hydrocodone Itching    Any combination drug containing hydrocodone also makes patient itch. She states she still takes this medication but takes Benadryl along with it. Any combination drug containing hydrocodone also makes patient itch  . Vicodin [Hydrocodone-Acetaminophen] Itching  . Ciprofloxacin Other (See Comments)    Unknown, found from records  . Tramadol Nausea Only    Current Outpatient Medications on File Prior to Visit  Medication Sig Dispense Refill  . phentermine (ADIPEX-P) 37.5 MG tablet Take 37.5 mg by mouth daily before breakfast.     No current facility-administered medications on file prior to visit.    BP 124/80   Pulse 78   Temp 97.7 F (36.5 C) (Temporal)   Ht 5' 3.5" (1.613 m)   Wt 220 lb 8 oz (100 kg)   LMP 10/16/2019   SpO2 98%   BMI 38.45 kg/m    Objective:   Physical Exam  Constitutional: She appears well-nourished.  Cardiovascular: Normal rate and regular rhythm.  Respiratory: Effort normal and breath sounds normal.  GI: Soft. Bowel sounds are normal. There is no abdominal tenderness. There is no CVA tenderness.  Musculoskeletal:     Cervical back: Neck supple.     Lumbar back: Pain present. No tenderness or bony tenderness. Decreased range of motion.       Back:     Comments: 5/5 strength to bilateral lower extremities, negative straight leg raise.  Skin: Skin is warm and dry.  Psychiatric: She has a normal mood and affect.           Assessment & Plan:

## 2019-10-24 DIAGNOSIS — M545 Low back pain, unspecified: Secondary | ICD-10-CM

## 2019-10-24 MED ORDER — DICLOFENAC SODIUM 75 MG PO TBEC: 75.0000 mg | DELAYED_RELEASE_TABLET | Freq: Two times a day (BID) | ORAL | 0 refills | Status: DC | PRN

## 2019-11-17 ENCOUNTER — Other Ambulatory Visit: Payer: Self-pay | Admitting: Primary Care

## 2019-11-17 DIAGNOSIS — M545 Low back pain, unspecified: Secondary | ICD-10-CM

## 2019-11-19 NOTE — Telephone Encounter (Signed)
Last prescribed on 10/24/2019  . Last appointment on 10/16/2019. No future appointment

## 2020-02-08 ENCOUNTER — Telehealth: Payer: Self-pay | Admitting: Primary Care

## 2020-02-08 DIAGNOSIS — T3695XA Adverse effect of unspecified systemic antibiotic, initial encounter: Secondary | ICD-10-CM

## 2020-02-08 MED ORDER — FLUCONAZOLE 150 MG PO TABS: 150.0000 mg | ORAL_TABLET | Freq: Once | ORAL | 0 refills | Status: AC

## 2020-02-08 NOTE — Telephone Encounter (Signed)
Patient went to her dentist on Wednesday for what she thought was a tooth ache The dentist stated it was her sinuses causing the pain and he prescribed her Amoxicillin.  She stated she started this on Wednesday and she now has a yeast infection.  She wanted to know if there is anyway you could prescribed something to help.  Patient tried to contact her dentist but they are closed until Monday morning.      [facility] [address] [phone]

## 2020-02-08 NOTE — Telephone Encounter (Signed)
Patient is aware and message through MyChart

## 2020-02-08 NOTE — Telephone Encounter (Signed)
Noted. Will send in one fluconazole tablet for presumed antibiotic induced yeast infection. If symptoms do not resolve then she will need to be seen. Hope this helps!

## 2020-10-20 ENCOUNTER — Ambulatory Visit: Payer: Managed Care, Other (non HMO) | Admitting: Primary Care

## 2020-10-20 ENCOUNTER — Encounter: Payer: Self-pay | Admitting: Primary Care

## 2020-10-20 ENCOUNTER — Ambulatory Visit (INDEPENDENT_AMBULATORY_CARE_PROVIDER_SITE_OTHER)
Admission: RE | Admit: 2020-10-20 | Discharge: 2020-10-20 | Disposition: A | Discharge status: Home / Self Care | Payer: Managed Care, Other (non HMO) | Source: Ambulatory Visit | Attending: Primary Care | Admitting: Primary Care

## 2020-10-20 ENCOUNTER — Other Ambulatory Visit: Payer: Self-pay

## 2020-10-20 VITALS — BP 120/82 | HR 96 | Temp 98.4°F | Ht 63.0 in | Wt 198.8 lb

## 2020-10-20 DIAGNOSIS — G8929 Other chronic pain: Secondary | ICD-10-CM

## 2020-10-20 DIAGNOSIS — M79671 Pain in right foot: Secondary | ICD-10-CM

## 2020-10-20 LAB — COMPREHENSIVE METABOLIC PANEL
ALT: 12 U/L (ref 0–35)
AST: 15 U/L (ref 0–37)
Albumin: 4.2 g/dL (ref 3.5–5.2)
Alkaline Phosphatase: 50 U/L (ref 39–117)
BUN: 10 mg/dL (ref 6–23)
CO2: 28 mEq/L (ref 19–32)
Calcium: 9.1 mg/dL (ref 8.4–10.5)
Chloride: 104 mEq/L (ref 96–112)
Creatinine, Ser: 0.57 mg/dL (ref 0.40–1.20)
GFR: 106.89 mL/min (ref >60.00)
Glucose, Bld: 101 mg/dL — ABNORMAL HIGH (ref 70–99)
Potassium: 4.2 mEq/L (ref 3.5–5.1)
Sodium: 136 mEq/L (ref 135–145)
Total Bilirubin: 0.4 mg/dL (ref 0.2–1.2)
Total Protein: 7.1 g/dL (ref 6.0–8.3)

## 2020-10-20 LAB — HEMOGLOBIN A1C: Hgb A1c MFr Bld: 5.5 % (ref 4.6–6.5)

## 2020-10-20 LAB — CBC WITH DIFFERENTIAL/PLATELET
Basophils Absolute: 0 10*3/uL (ref 0.0–0.1)
Basophils Relative: 0.7 % (ref 0.0–3.0)
Eosinophils Absolute: 0.1 10*3/uL (ref 0.0–0.7)
Eosinophils Relative: 2.3 % (ref 0.0–5.0)
HCT: 38.4 % (ref 36.0–46.0)
Hemoglobin: 12.8 g/dL (ref 12.0–15.0)
Lymphocytes Relative: 33.5 % (ref 12.0–46.0)
Lymphs Abs: 1.5 10*3/uL (ref 0.7–4.0)
MCHC: 33.3 g/dL (ref 30.0–36.0)
MCV: 93.9 fl (ref 78.0–100.0)
Monocytes Absolute: 0.3 10*3/uL (ref 0.1–1.0)
Monocytes Relative: 6.5 % (ref 3.0–12.0)
Neutro Abs: 2.6 10*3/uL (ref 1.4–7.7)
Neutrophils Relative %: 57 % (ref 43.0–77.0)
Platelets: 265 10*3/uL (ref 150.0–400.0)
RBC: 4.09 Mil/uL (ref 3.87–5.11)
RDW: 13.6 % (ref 11.5–15.5)
WBC: 4.5 10*3/uL (ref 4.0–10.5)

## 2020-10-20 NOTE — Assessment & Plan Note (Signed)
Fissured and cracked, no infection today. Chronic and causes a lot of pain.   Checking xray today. Checking labs today. Referral placed to podiatry.  Consider topical treatment, will consult with dermatology.

## 2020-10-20 NOTE — Patient Instructions (Signed)
Stop by the lab and xray prior to leaving today. I will notify you of your results once received.   You will be contacted regarding your referral to podiatry.  Please let us know if you have not been contacted within two weeks.   It was a pleasure to see you today!  

## 2020-10-20 NOTE — Progress Notes (Signed)
Subjective:    Patient ID: Jill Meyers, female    DOB: [birth year], 50 y.o.   MRN: [id number]  HPI  This visit occurred during the SARS-CoV-2 public health emergency.  Safety protocols were in place, including screening questions prior to the visit, additional usage of staff PPE, and extensive cleaning of exam room while observing appropriate contact time as indicated for disinfecting solutions.   Jill Meyers is a 50 year old female who presents today with a chief complaint of heel pain.  Chronic to the right heel which began years ago. Her heel is very dry with cracked skin and callouses located to the posterior heel. Her skin will crack open after prolonged walking, then will callous with rest. The cracked region will callous, then soften up with application of shoes and socks.   She's tried applying Vasaline, Heel Balm, numerous other OTC treatments without improvement. She recently did a Ped Egg which helped initially but then cracked back open shortly after. She's tried switching her shoes without improvement. Winter and Summer months are worse. She's never seen podiatry.   She denies erythema, drainage, warmth, injury. She has a family history of diabetes. She's been checking her glucose readings on her husbands meter and has been getting readings below 100.  Her heel is so painful that she cannot walk for more than a few minutes without severe burning.   BP Readings from Last 3 Encounters:  10/20/20 120/82  10/16/19 124/80  07/11/19 124/82   Wt Readings from Last 3 Encounters:  10/20/20 198 lb 12.8 oz (90.2 kg)  10/16/19 220 lb 8 oz (100 kg)  07/11/19 216 lb (98 kg)     Review of Systems  Constitutional: Negative for fever.  Skin: Negative for color change and wound.       Cracked, fissured skin  Neurological: Negative for numbness.       Past Medical History:  Diagnosis Date  . Allergic rhinitis   . Chickenpox   . Constipation   . Kidney stones   . Panic attacks    . Prediabetes   . UTI (urinary tract infection)      Social History   Socioeconomic History  . Marital status: Married    Spouse name: Not on file  . Number of children: Not on file  . Years of education: Not on file  . Highest education level: Not on file  Occupational History  . Not on file  Tobacco Use  . Smoking status: Never Smoker  . Smokeless tobacco: Never Used  Substance and Sexual Activity  . Alcohol use: Not Currently  . Drug use: Not on file  . Sexual activity: Not on file  Other Topics Concern  . Not on file  Social History Narrative   Married.   Works as a Scientist, clinical (histocompatibility and immunogenetics).    Social Determinants of Health   Financial Resource Strain: Not on file  Food Insecurity: Not on file  Transportation Needs: Not on file  Physical Activity: Not on file  Stress: Not on file  Social Connections: Not on file  Intimate Partner Violence: Not on file    Past Surgical History:  Procedure Laterality Date  . CHOLECYSTECTOMY  1996  . LITHOTRIPSY      Family History  Problem Relation Age of Onset  . Arthritis Mother   . Hyperlipidemia Mother   . Breast cancer Mother   . Hyperlipidemia Father   . Hypertension Father   . Stroke Father   . Throat cancer  Father   . Alcohol abuse Father   . Arthritis Maternal Grandmother   . Hearing loss Maternal Grandmother   . Arthritis Maternal Grandfather   . Heart disease Maternal Grandfather   . Hypertension Maternal Grandfather     Allergies  Allergen Reactions  . Prednisone Swelling    Facial swelling  . Ciprofloxacin Other (See Comments)    Unknown, found from records  . Tramadol Nausea Only    Current Outpatient Medications on File Prior to Visit  Medication Sig Dispense Refill  . phentermine (ADIPEX-P) 37.5 MG tablet Take 37.5 mg by mouth daily before breakfast.     No current facility-administered medications on file prior to visit.    BP 120/82   Pulse 96   Temp 98.4 F (36.9 C) (Oral)   Ht 5\' 3"   (1.6 m)   Wt 198 lb 12.8 oz (90.2 kg)   SpO2 100%   BMI 35.22 kg/m    Objective:   Physical Exam Constitutional:      Appearance: She is well-nourished.  Cardiovascular:     Rate and Rhythm: Normal rate and regular rhythm.  Pulmonary:     Effort: Pulmonary effort is normal.     Breath sounds: Normal breath sounds.  Musculoskeletal:     Cervical back: Neck supple.  Skin:    General: Skin is warm and dry.     Comments: Fissured skin to right heel without erythema, drainage, warmth. Calloused. See pic.  Psychiatric:        Mood and Affect: Mood and affect normal.             Assessment & Plan:

## 2020-10-28 ENCOUNTER — Other Ambulatory Visit: Payer: Self-pay | Admitting: Family Medicine

## 2020-10-28 ENCOUNTER — Other Ambulatory Visit (INDEPENDENT_AMBULATORY_CARE_PROVIDER_SITE_OTHER): Payer: Managed Care, Other (non HMO)

## 2020-10-28 ENCOUNTER — Telehealth (INDEPENDENT_AMBULATORY_CARE_PROVIDER_SITE_OTHER): Payer: Managed Care, Other (non HMO) | Admitting: Family Medicine

## 2020-10-28 VITALS — Temp 97.7°F

## 2020-10-28 DIAGNOSIS — Z20822 Contact with and (suspected) exposure to covid-19: Secondary | ICD-10-CM

## 2020-10-28 NOTE — Progress Notes (Signed)
I connected with Jill Meyers on 10/29/20 at  3:40 PM EST by video and verified that I am speaking with the correct person using two identifiers.   I discussed the limitations, risks, security and privacy concerns of performing an evaluation and management service by video and the availability of in person appointments. I also discussed with the patient that there may be a patient responsible charge related to this service. The patient expressed understanding and agreed to proceed.  Patient location: Home Provider Location: [facility] Participants: Lynnda Child and Jill Meyers   Subjective:     Jill Meyers is a 50 y.o. female presenting for Covid Exposure (X 1 week ago by husband. Tested negative on 10/24/20.), Sore Throat (Started on 10/25/20), and Cough (Dry mostly, with occasional bloody mucous. )     Sore Throat  This is a new problem. Episode onset: 10/25/2020. Maximum temperature: 99. Associated symptoms include coughing and headaches. Pertinent negatives include no ear pain or shortness of breath. Associated symptoms comments: Burning sensation.  Cough This is a new problem. Episode onset: 10/25/20. The cough is productive of purulent sputum and productive of blood-tinged sputum. Associated symptoms include a fever, headaches, postnasal drip, a sore throat and sweats. Pertinent negatives include no chills, ear congestion, ear pain, myalgias, nasal congestion, shortness of breath or wheezing. Treatments tried: alkasetzer cold and flu. Her past medical history is significant for bronchitis.   Brings back memories of bronchitis  Husband with covid - separated 1 week in the house  Took a home covid test on 10/26/2020  Review of Systems  Constitutional: Positive for fever. Negative for chills.  HENT: Positive for postnasal drip and sore throat. Negative for ear pain.   Respiratory: Positive for cough. Negative for shortness of breath and wheezing.   Musculoskeletal:  Negative for myalgias.  Neurological: Positive for headaches.     Social History   Tobacco Use  Smoking Status Never Smoker  Smokeless Tobacco Never Used        Objective:   BP Readings from Last 3 Encounters:  10/20/20 120/82  10/16/19 124/80  07/11/19 124/82   Wt Readings from Last 3 Encounters:  10/20/20 198 lb 12.8 oz (90.2 kg)  10/16/19 220 lb 8 oz (100 kg)  07/11/19 216 lb (98 kg)   Temp 97.7 F (36.5 C) (Oral)    Physical Exam Constitutional:      Appearance: Normal appearance. She is not ill-appearing.  HENT:     Head: Normocephalic and atraumatic.     Right Ear: External ear normal.     Left Ear: External ear normal.  Eyes:     Conjunctiva/sclera: Conjunctivae normal.  Pulmonary:     Effort: Pulmonary effort is normal. No respiratory distress.     Comments: Coughing occasionally Neurological:     Mental Status: She is alert. Mental status is at baseline.  Psychiatric:        Mood and Affect: Mood normal.        Behavior: Behavior normal.        Thought Content: Thought content normal.        Judgment: Judgment normal.             Assessment & Plan:   Problem List Items Addressed This Visit   None   Visit Diagnoses    Suspected COVID-19 virus infection    -  Primary     Discussed OTC treatment for viral illness Patient will come today for drive-up  testing Instructed to isolate until the results come back     Return if symptoms worsen or fail to improve.  Lynnda Child, MD

## 2020-10-28 NOTE — Patient Instructions (Addendum)
Get tested for covid today  Isolate until you get results  Based on your symptoms, it looks like you have a virus.   Antibiotics are not need for a viral infection but the following will help:   1. Drink plenty of fluids 2. Get lots of rest  Sinus Congestion 1) Neti Pot (Saline rinse) -- 2 times day -- if tolerated 2) Flonase (Store Brand ok) - once daily 3) Over the counter congestion medications  Cough 1) Cough drops can be helpful 2) Nyquil (or nighttime cough medication) 3) Honey is proven to be one of the best cough medications  4) Cough medicine with Dextromethorphan can also be helpful  Sore Throat 1) Honey as above, cough drops 2) Ibuprofen or Aleve can be helpful 3) Salt water Gargles  If you develop fevers (Temperature >100.4), chills, worsening symptoms or symptoms lasting longer than 10 days return to clinic.

## 2020-10-29 ENCOUNTER — Other Ambulatory Visit (INDEPENDENT_AMBULATORY_CARE_PROVIDER_SITE_OTHER): Payer: Managed Care, Other (non HMO) | Admitting: Family Medicine

## 2020-10-29 ENCOUNTER — Telehealth: Payer: Self-pay

## 2020-10-29 ENCOUNTER — Encounter: Payer: Self-pay | Admitting: Family Medicine

## 2020-10-29 ENCOUNTER — Other Ambulatory Visit (INDEPENDENT_AMBULATORY_CARE_PROVIDER_SITE_OTHER): Payer: Managed Care, Other (non HMO)

## 2020-10-29 ENCOUNTER — Telehealth: Payer: Managed Care, Other (non HMO) | Admitting: Family Medicine

## 2020-10-29 DIAGNOSIS — J029 Acute pharyngitis, unspecified: Secondary | ICD-10-CM | POA: Diagnosis not present

## 2020-10-29 LAB — POCT RAPID STREP A (OFFICE): Rapid Strep A Screen: NEGATIVE

## 2020-10-29 NOTE — Telephone Encounter (Signed)
Pt already has video visit with Dr Selena Batten 10/29/20 at 11:20.

## 2020-10-29 NOTE — Telephone Encounter (Signed)
[facility] Day - Client TELEPHONE ADVICE RECORD [facility]urse Patient Name: Jill Meyers Gender: Female DOB: October 20, 1970 Age: 50 Y 2 M 13 D Return Phone Number: [phone] (Primary) [place] [address] Client [facility] Day - Client Client Site [facility] - Day Physician Vernona Rieger - NP Contact Type Call Who Is Calling Patient / Member / Family / Caregiver Call Type Triage / Clinical Relationship To Patient Self Return Phone Number [phone] (Primary) Chief Complaint Sore Throat Reason for Call Symptomatic / Request for Health Information Initial Comment Transferred from office, PT has a sore throat, cough and this mornig is totally worse, She can barely swallow or drink anything. Has a virtual today appt at 1120. Translation No Nurse Assessment Nurse: Scarlette Ar, RN, Heather Date/Time (Eastern Time): 10/29/2020 8:22:02 AM Confirm and document reason for call. If symptomatic, describe symptoms. ---Caller states that she has a sore throat, cough and this morning is totally worse, She can barely swallow or drink anything. Has a virtual today appt at 1120. This started yesterday. Does the patient have any new or worsening symptoms? ---Yes Will a triage be completed? ---Yes Related visit to physician within the last 2 weeks? ---No Does the PT have any chronic conditions? (i.e. diabetes, asthma, this includes High risk factors for pregnancy, etc.) ---No Is the patient pregnant or possibly pregnant? (Ask all females between the ages of 44-55) ---No Is this a behavioral health or substance abuse call? ---No Guidelines Guideline Title Affirmed Question Affirmed Notes Nurse Date/Time Lamount Cohen Time) Sore Throat SEVERE (e.g., excruciating) throat pain Standifer, RN, Heather 10/29/2020 8:23:39 AM Disp. Time Lamount Cohen Time) Disposition Final User 10/29/2020 8:31:24 AM See PCP within  24 Hours Yes Standifer, RN, Administrator, Civil Service Disagree/Comply Comply PLEASE NOTE: All timestamps contained within this report are represented as [place] Standard Time. CONFIDENTIALTY NOTICE: This fax transmission is intended only for the addressee. It contains information that is legally privileged, confidential or otherwise protected from use or disclosure. If you are not the intended recipient, you are strictly prohibited from reviewing, disclosing, copying using or disseminating any of this information or taking any action in reliance on or regarding this information. If you have received this fax in error, please notify us immediately by telephone so that we can arrange for its return to [place]. Phone: [phone], Toll-Free: [phone], Fax: [phone] Page: 2 of 2 Call Id: [id number] Caller Understands Yes PreDisposition Call Doctor Care Advice Given Per Guideline SEE PCP WITHIN 24 HOURS: * IF OFFICE WILL BE OPEN: You need to be examined within the next 24 hours. Call your doctor (or NP/PA) when the office opens and make an appointment. * IBUPROFEN (E.G., MOTRIN, ADVIL): Take 400 mg (two 200 mg pills) by mouth every 6 hours. The most you should take each day is 1,200 mg (six 200 mg pills), unless your doctor has told you to take more. CALL BACK IF: * You become worse DRINK PLENTY OF LIQUIDS: * Drink plenty of liquids. It is important to stay well-hydrated. CARE ADVICE given per Sore Throat (Adult) guideline. Comments User: Tyrone Apple, RN Date/Time Lamount Cohen Time): 10/29/2020 8:32:30 AM She has an appt at 1120 this morning. Referrals REFERRED TO PCP OFFICE

## 2020-10-29 NOTE — Telephone Encounter (Signed)
Please call patient.   Did she come for Covid testing yesterday?   She can keep virtual visit, but we could also plan to test for strept throat this afternoon and covid - without visit since we talked yesterday.   And could send steroids if ibuprofen is not helping with swelling to see if that helps (if strept is negative).   If she is having new high fevers, would recommend urgent care for in-person evaluation

## 2020-10-29 NOTE — Telephone Encounter (Signed)
Yes pt came in yesterday for covid test.  She is scheduled for strep test this afternoon at 2:45.  Ibuprofen helped for a few days but it's not helping much now. She does not handle steroids well. They cause major swelling/pressure in her eyes.  No fever. Temp is now 98.3 (Oral)

## 2020-10-30 ENCOUNTER — Ambulatory Visit
Admission: EM | Admit: 2020-10-30 | Discharge: 2020-10-30 | Disposition: A | Discharge status: Home / Self Care | Payer: Managed Care, Other (non HMO) | Attending: Family Medicine | Admitting: Family Medicine

## 2020-10-30 ENCOUNTER — Other Ambulatory Visit: Payer: Self-pay

## 2020-10-30 ENCOUNTER — Ambulatory Visit: Payer: Self-pay

## 2020-10-30 ENCOUNTER — Encounter: Payer: Self-pay | Admitting: Emergency Medicine

## 2020-10-30 ENCOUNTER — Encounter: Payer: Self-pay | Admitting: Family Medicine

## 2020-10-30 DIAGNOSIS — J029 Acute pharyngitis, unspecified: Secondary | ICD-10-CM

## 2020-10-30 DIAGNOSIS — J0110 Acute frontal sinusitis, unspecified: Secondary | ICD-10-CM

## 2020-10-30 MED ORDER — FLUCONAZOLE 150 MG PO TABS: 150.0000 mg | ORAL_TABLET | Freq: Every day | ORAL | 0 refills | Status: DC

## 2020-10-30 MED ORDER — ACETAMINOPHEN-CODEINE #3 300-30 MG PO TABS: 1.0000 | ORAL_TABLET | Freq: Four times a day (QID) | ORAL | 0 refills | Status: DC | PRN

## 2020-10-30 MED ORDER — AMOXICILLIN-POT CLAVULANATE 875-125 MG PO TABS: 1.0000 | ORAL_TABLET | Freq: Two times a day (BID) | ORAL | 0 refills | Status: DC

## 2020-10-30 NOTE — Discharge Instructions (Addendum)
Treating you for possible sinus infection Diflucan for yeast infection if needed  Tylenol 3 for severe pain and to help rest. I prefer to  do steroid but due to allergy we are unable.  Flonase daily, 2 sprays in each nostril.  You can also continue ibuprofen as needed.  Follow up as needed for continued or worsening symptoms

## 2020-10-30 NOTE — ED Triage Notes (Signed)
Pt presents today with sore throat and nasal congestion x 4 days.Tested for covid on 10/28/20 and she has not received the results.

## 2020-10-31 ENCOUNTER — Telehealth: Payer: Self-pay

## 2020-10-31 LAB — SARS-COV-2, NAA 2 DAY TAT

## 2020-10-31 LAB — NOVEL CORONAVIRUS, NAA: SARS-CoV-2, NAA: DETECTED — AB

## 2020-10-31 NOTE — ED Provider Notes (Signed)
Renaldo Fiddler    CSN: [id number] Arrival date & time: 10/30/20  1247      History   Chief Complaint Chief Complaint  Patient presents with  . Sore Throat  . Nasal Congestion    HPI Jill Meyers is a 50 y.o. female.   Pt is a 50 year old female that presents with nasal congestion, sinus pressure, sore throat, fatigue, ear pain. Symptoms have worsened over the past week. Severe sore throat, not relieved with OTC medicines.  Has had body aches and chills. Tested for covid on 10/28/20. Has not received results yet.      Past Medical History:  Diagnosis Date  . Allergic rhinitis   . Chickenpox   . Constipation   . Kidney stones   . Panic attacks   . Prediabetes   . UTI (urinary tract infection)     Patient Active Problem List   Diagnosis Date Noted  . Chronic pain of right heel 10/20/2020  . Flank pain 10/16/2019  . Chronic back pain 08/11/2018  . Obesity (BMI 35.0-39.9 without comorbidity) 08/11/2018    Past Surgical History:  Procedure Laterality Date  . CHOLECYSTECTOMY  1996  . LITHOTRIPSY      OB History   No obstetric history on file.      Home Medications    Prior to Admission medications   Medication Sig Start Date End Date Taking? Authorizing Provider  acetaminophen-codeine (TYLENOL #3) 300-30 MG tablet Take 1-2 tablets by mouth every 6 (six) hours as needed for moderate pain. 10/30/20  Yes Harel Repetto A, NP  amoxicillin-clavulanate (AUGMENTIN) 875-125 MG tablet Take 1 tablet by mouth every 12 (twelve) hours. 10/30/20  Yes Fintan Grater A, NP  fluconazole (DIFLUCAN) 150 MG tablet Take 1 tablet (150 mg total) by mouth daily. Take one now and one at the end of the antibiotic treatment 10/30/20  Yes Shamir Tuzzolino A, NP  Chlorphen-Phenyleph-ASA (ALKA-SELTZER PLUS COLD PO) Take by mouth as needed.    [provider]  phentermine (ADIPEX-P) 37.5 MG tablet Take 37.5 mg by mouth daily before breakfast. Patient not taking: Reported on 10/28/2020     [provider]    Family History Family History  Problem Relation Age of Onset  . Arthritis Mother   . Hyperlipidemia Mother   . Breast cancer Mother   . Hyperlipidemia Father   . Hypertension Father   . Stroke Father   . Throat cancer Father   . Alcohol abuse Father   . Arthritis Maternal Grandmother   . Hearing loss Maternal Grandmother   . Arthritis Maternal Grandfather   . Heart disease Maternal Grandfather   . Hypertension Maternal Grandfather     Social History Social History   Tobacco Use  . Smoking status: Never Smoker  . Smokeless tobacco: Never Used  Substance Use Topics  . Alcohol use: Not Currently     Allergies   Prednisone, Ciprofloxacin, and Tramadol   Review of Systems Review of Systems   Physical Exam Triage Vital Signs ED Triage Vitals  Enc Vitals Group     BP 10/30/20 1253 (!) 159/104     Pulse Rate 10/30/20 1253 94     Resp 10/30/20 1253 18     Temp 10/30/20 1253 98.3 F (36.8 C)     Temp Source 10/30/20 1253 Oral     SpO2 10/30/20 1253 97 %     Weight --      Height --      Head  Circumference --      Peak Flow --      Pain Score 10/30/20 1257 10     Pain Loc --      Pain Edu? --      Excl. in GC? --    No data found.  Updated Vital Signs BP (!) 159/104 (BP Location: Left Arm)   Pulse 94   Temp 98.3 F (36.8 C) (Oral)   Resp 18   LMP 10/30/2020   SpO2 97%   Visual Acuity Right Eye Distance:   Left Eye Distance:   Bilateral Distance:    Right Eye Near:   Left Eye Near:    Bilateral Near:     Physical Exam Vitals and nursing note reviewed.  Constitutional:      General: She is not in acute distress.    Appearance: Normal appearance. She is not ill-appearing, toxic-appearing or diaphoretic.  HENT:     Head: Normocephalic.     Right Ear: Tympanic membrane and ear canal normal.     Left Ear: Tympanic membrane and ear canal normal.     Nose: Congestion present.     Mouth/Throat:     Pharynx: Oropharynx  is clear.  Eyes:     Conjunctiva/sclera: Conjunctivae normal.  Cardiovascular:     Rate and Rhythm: Normal rate and regular rhythm.  Pulmonary:     Effort: Pulmonary effort is normal.     Breath sounds: Normal breath sounds.  Musculoskeletal:        General: Normal range of motion.     Cervical back: Normal range of motion.  Skin:    General: Skin is warm and dry.     Findings: No rash.  Neurological:     Mental Status: She is alert.  Psychiatric:        Mood and Affect: Mood normal.      UC Treatments / Results  Labs (all labs ordered are listed, but only abnormal results are displayed) Labs Reviewed - No data to display  EKG   Radiology No results found.  Procedures Procedures (including critical care time)  Medications Ordered in UC Medications - No data to display  Initial Impression / Assessment and Plan / UC Course  I have reviewed the triage vital signs and the nursing notes.  Pertinent labs & imaging results that were available during my care of the patient were reviewed by me and considered in my medical decision making (see chart for details).     Sore throat and sinusitis Treating for possible acute sinus infection She hasn't received her Covid results yet. Could be covid Diflucan for yeast infection.  Tylenol 3 for severe throat pain as needed.  Flonase daily.  Ibuprofen as needed.  Follow up as needed for continued or worsening symptoms  Final Clinical Impressions(s) / UC Diagnoses   Final diagnoses:  Sore throat  Acute non-recurrent frontal sinusitis     Discharge Instructions     Treating you for possible sinus infection Diflucan for yeast infection if needed  Tylenol 3 for severe pain and to help rest. I prefer to  do steroid but due to allergy we are unable.  Flonase daily, 2 sprays in each nostril.  You can also continue ibuprofen as needed.  Follow up as needed for continued or worsening symptoms      ED Prescriptions     Medication Sig Dispense Auth. Provider   amoxicillin-clavulanate (AUGMENTIN) 875-125 MG tablet Take 1 tablet by mouth every 12 (twelve)  hours. 14 tablet Noeli Lavery A, NP   fluconazole (DIFLUCAN) 150 MG tablet Take 1 tablet (150 mg total) by mouth daily. Take one now and one at the end of the antibiotic treatment 2 tablet Maurion Walkowiak A, NP   acetaminophen-codeine (TYLENOL #3) 300-30 MG tablet Take 1-2 tablets by mouth every 6 (six) hours as needed for moderate pain. 15 tablet Kimaria Struthers A, NP     I have reviewed the PDMP during this encounter.   Janace Aris, NP 10/31/20 [phone]

## 2020-10-31 NOTE — Telephone Encounter (Signed)
Pt called in wanting to confirm her Covid test results, as she was told from the [facility] that her test was negative. I confirmed to the patient that her covid test is positive. Pt inquired about how she should treat her symptoms: cough, headache and sore throat, and I told her that mucinex dm, robitussin, zinc, vitamin d and vitamin c are helpful to treat these symptoms. Pt states she will send her husband to pick up meds.

## 2021-05-13 ENCOUNTER — Encounter: Payer: Self-pay | Admitting: Primary Care

## 2021-05-13 ENCOUNTER — Ambulatory Visit: Payer: Managed Care, Other (non HMO) | Admitting: Primary Care

## 2021-05-13 ENCOUNTER — Other Ambulatory Visit: Payer: Self-pay

## 2021-05-13 VITALS — BP 146/88 | HR 86 | Temp 98.6°F | Ht 63.0 in | Wt 209.0 lb

## 2021-05-13 DIAGNOSIS — R1012 Left upper quadrant pain: Secondary | ICD-10-CM | POA: Diagnosis not present

## 2021-05-13 DIAGNOSIS — M546 Pain in thoracic spine: Secondary | ICD-10-CM | POA: Diagnosis not present

## 2021-05-13 DIAGNOSIS — Z114 Encounter for screening for human immunodeficiency virus [HIV]: Secondary | ICD-10-CM | POA: Diagnosis not present

## 2021-05-13 DIAGNOSIS — Z1159 Encounter for screening for other viral diseases: Secondary | ICD-10-CM

## 2021-05-13 HISTORY — DX: Pain in thoracic spine: M54.6

## 2021-05-13 LAB — POC URINALSYSI DIPSTICK (AUTOMATED)
Bilirubin, UA: NEGATIVE
Blood, UA: NEGATIVE
Glucose, UA: NEGATIVE
Ketones, UA: NEGATIVE
Leukocytes, UA: NEGATIVE
Nitrite, UA: NEGATIVE
Protein, UA: NEGATIVE
Spec Grav, UA: 1.015 (ref 1.010–1.025)
Urobilinogen, UA: 0.2 E.U./dL
pH, UA: 8 (ref 5.0–8.0)

## 2021-05-13 LAB — HM MAMMOGRAPHY

## 2021-05-13 MED ORDER — TIZANIDINE HCL 4 MG PO TABS: 4.0000 mg | ORAL_TABLET | Freq: Three times a day (TID) | ORAL | 0 refills | Status: DC | PRN

## 2021-05-13 NOTE — Patient Instructions (Addendum)
Stop by the lab prior to leaving today. I will notify you of your results once received.   Complete your xray as discussed.  You can take the tizanidine muscle relaxer every 8 hours as needed for muscle cramping/spasms and back pain.  It was a pleasure to see you today!

## 2021-05-13 NOTE — Assessment & Plan Note (Addendum)
Bilaterally, acute.   Do suspect stress is contributing. Abdominal work up in process. Trial of tizanidine muscle relaxer sent to pharmacy.   Work note provided to remain out tomorrow and Friday. She agrees.   If all work up is negative, consider treatment for anxiety/stress.

## 2021-05-13 NOTE — Assessment & Plan Note (Signed)
Acute for the last week, also with back myalgias.  Unclear etiology.  Will check H pylori stool, lipase, CBC, CMP. UA today negative. Will also check abdominal xray for evaluation of any renal stones or constipation.  Do suspect some of her symptoms could be secondary to stress. Discussed this today.  Await results. Exam today without abnormality.

## 2021-05-13 NOTE — Progress Notes (Signed)
Subjective:    Patient ID: Jill Meyers, female    DOB: 1970/12/03, 50 y.o.   MRN: [id number]  HPI  Tiphany Fayson is a very pleasant 50 y.o. female with a history of flank pain and chronic back pain who presents today to discuss abdominal pain.   Her pain is located to the left upper abdomen which began about one week ago, intermittent, dullness. She used a suppository four days later, had a bowel movement, noticed temporary improvement. Since then she's noticed the same, intermittent, dull pain to the left upper abdomen. She believes she's noticed swelling to the left upper abdomen for which she doesn't notice to the right.   She then began to notice bilateral upper thoracic back pain that radiates through from her left upper abdomen, improved after a heating pad to the back, taking Tylenol and Ibuprofen. Overall improved but continues to notice pain mostly with walking.   She's noticed irregular menstrual cycles for the last 6+ months, hot flashes, mood swings. She's been under a tremendous amount of stress with work and family, feels that she's always tense  She has chronic bilateral lower back pain, increased pain for the last five days. She denies lower extremity numbness or pain. She's applied Blu Emu which has helped.  She denies urinary symptoms of frequency, urgency, hematuria, fevers. She also denies nausea, vomiting, diarrhea, bloody stools. She's never undergone colonoscopy. She drinks 60 oz of water daily. She has bowel movement once daily.    BP Readings from Last 3 Encounters:  05/13/21 (!) 146/88  10/30/20 (!) 159/104  10/20/20 120/82        Review of Systems  Constitutional:  Negative for fever.  Gastrointestinal:  Positive for abdominal pain. Negative for blood in stool, diarrhea, nausea and vomiting.  Genitourinary:  Positive for flank pain. Negative for dysuria, frequency, hematuria and vaginal discharge.       Irregular menstrual cycles x 6+ months    Musculoskeletal:  Positive for back pain and myalgias.  Psychiatric/Behavioral:  The patient is nervous/anxious.         Past Medical History:  Diagnosis Date   Allergic rhinitis    Chickenpox    Constipation    Kidney stones    Panic attacks    Prediabetes    UTI (urinary tract infection)     Social History   Socioeconomic History   Marital status: Married    Spouse name: Not on file   Number of children: Not on file   Years of education: Not on file   Highest education level: Not on file  Occupational History   Not on file  Tobacco Use   Smoking status: Never   Smokeless tobacco: Never  Substance and Sexual Activity   Alcohol use: Not Currently   Drug use: Not on file   Sexual activity: Not on file  Other Topics Concern   Not on file  Social History Narrative   Married.   Works as a Scientist, clinical (histocompatibility and immunogenetics).    Social Determinants of Health   Financial Resource Strain: Not on file  Food Insecurity: Not on file  Transportation Needs: Not on file  Physical Activity: Not on file  Stress: Not on file  Social Connections: Not on file  Intimate Partner Violence: Not on file    Past Surgical History:  Procedure Laterality Date   CHOLECYSTECTOMY  1996   LITHOTRIPSY      Family History  Problem Relation Age of Onset   Arthritis Mother  Hyperlipidemia Mother    Breast cancer Mother    Hyperlipidemia Father    Hypertension Father    Stroke Father    Throat cancer Father    Alcohol abuse Father    Arthritis Maternal Grandmother    Hearing loss Maternal Grandmother    Arthritis Maternal Grandfather    Heart disease Maternal Grandfather    Hypertension Maternal Grandfather     Allergies  Allergen Reactions   Prednisone Swelling    Facial swelling   Ciprofloxacin Other (See Comments)    Unknown, found from records   Tramadol Nausea Only    Current Outpatient Medications on File Prior to Visit  Medication Sig Dispense Refill   phentermine (ADIPEX-P)  37.5 MG tablet Take 37.5 mg by mouth daily before breakfast. (Patient not taking: No sig reported)     No current facility-administered medications on file prior to visit.    BP (!) 146/88   Pulse 86   Temp 98.6 F (37 C) (Temporal)   Ht 5\' 3"  (1.6 m)   Wt 209 lb (94.8 kg)   SpO2 99%   BMI 37.02 kg/m  Objective:   Physical Exam Constitutional:      General: She is not in acute distress. Cardiovascular:     Rate and Rhythm: Normal rate and regular rhythm.  Pulmonary:     Effort: Pulmonary effort is normal.     Breath sounds: Normal breath sounds.  Abdominal:     General: There is no distension.     Palpations: Abdomen is soft. There is no mass.     Tenderness: There is no abdominal tenderness. There is no right CVA tenderness or left CVA tenderness.  Musculoskeletal:     Cervical back: Neck supple.     Thoracic back: No tenderness or bony tenderness. Normal range of motion.     Lumbar back: No tenderness or bony tenderness. Negative right straight leg raise test and negative left straight leg raise test.       Back:  Skin:    General: Skin is warm and dry.          Assessment & Plan:      This visit occurred during the SARS-CoV-2 public health emergency.  Safety protocols were in place, including screening questions prior to the visit, additional usage of staff PPE, and extensive cleaning of exam room while observing appropriate contact time as indicated for disinfecting solutions.

## 2021-05-14 ENCOUNTER — Ambulatory Visit
Admission: RE | Admit: 2021-05-14 | Discharge: 2021-05-14 | Disposition: A | Discharge status: Home / Self Care | Payer: Managed Care, Other (non HMO) | Source: Ambulatory Visit | Attending: Primary Care | Admitting: Primary Care

## 2021-05-14 ENCOUNTER — Other Ambulatory Visit: Payer: Managed Care, Other (non HMO)

## 2021-05-14 DIAGNOSIS — R1012 Left upper quadrant pain: Secondary | ICD-10-CM | POA: Insufficient documentation

## 2021-05-14 LAB — COMPREHENSIVE METABOLIC PANEL
ALT: 41 U/L — ABNORMAL HIGH (ref 0–35)
AST: 28 U/L (ref 0–37)
Albumin: 4.5 g/dL (ref 3.5–5.2)
Alkaline Phosphatase: 52 U/L (ref 39–117)
BUN: 11 mg/dL (ref 6–23)
CO2: 29 mEq/L (ref 19–32)
Calcium: 9.7 mg/dL (ref 8.4–10.5)
Chloride: 101 mEq/L (ref 96–112)
Creatinine, Ser: 0.61 mg/dL (ref 0.40–1.20)
GFR: 104.74 mL/min (ref >60.00)
Glucose, Bld: 87 mg/dL (ref 70–99)
Potassium: 4.4 mEq/L (ref 3.5–5.1)
Sodium: 137 mEq/L (ref 135–145)
Total Bilirubin: 0.4 mg/dL (ref 0.2–1.2)
Total Protein: 7.4 g/dL (ref 6.0–8.3)

## 2021-05-14 LAB — CBC WITH DIFFERENTIAL/PLATELET
Basophils Absolute: 0 10*3/uL (ref 0.0–0.1)
Basophils Relative: 0.7 % (ref 0.0–3.0)
Eosinophils Absolute: 0.1 10*3/uL (ref 0.0–0.7)
Eosinophils Relative: 2 % (ref 0.0–5.0)
HCT: 38.2 % (ref 36.0–46.0)
Hemoglobin: 12.8 g/dL (ref 12.0–15.0)
Lymphocytes Relative: 27.8 % (ref 12.0–46.0)
Lymphs Abs: 1.4 10*3/uL (ref 0.7–4.0)
MCHC: 33.5 g/dL (ref 30.0–36.0)
MCV: 94 fl (ref 78.0–100.0)
Monocytes Absolute: 0.3 10*3/uL (ref 0.1–1.0)
Monocytes Relative: 6.6 % (ref 3.0–12.0)
Neutro Abs: 3.3 10*3/uL (ref 1.4–7.7)
Neutrophils Relative %: 62.9 % (ref 43.0–77.0)
Platelets: 236 10*3/uL (ref 150.0–400.0)
RBC: 4.06 Mil/uL (ref 3.87–5.11)
RDW: 13 % (ref 11.5–15.5)
WBC: 5.2 10*3/uL (ref 4.0–10.5)

## 2021-05-14 LAB — HEPATITIS C ANTIBODY
Hepatitis C Ab: NONREACTIVE
SIGNAL TO CUT-OFF: 0.01 (ref <1.00)

## 2021-05-14 LAB — HIV ANTIBODY (ROUTINE TESTING W REFLEX): HIV 1&2 Ab, 4th Generation: NONREACTIVE

## 2021-05-14 LAB — LIPASE: Lipase: 16 U/L (ref 11.0–59.0)

## 2021-05-15 LAB — HELICOBACTER PYLORI  SPECIAL ANTIGEN
MICRO NUMBER:: 12291263
SPECIMEN QUALITY: ADEQUATE

## 2021-05-18 ENCOUNTER — Encounter: Payer: Self-pay | Admitting: Primary Care

## 2021-08-05 ENCOUNTER — Other Ambulatory Visit: Payer: Self-pay

## 2021-08-05 ENCOUNTER — Encounter: Payer: Self-pay | Admitting: Primary Care

## 2021-08-05 ENCOUNTER — Ambulatory Visit: Payer: Managed Care, Other (non HMO) | Admitting: Primary Care

## 2021-08-05 VITALS — BP 136/80 | HR 87 | Temp 97.8°F | Ht 63.0 in | Wt 214.0 lb

## 2021-08-05 DIAGNOSIS — R229 Localized swelling, mass and lump, unspecified: Secondary | ICD-10-CM | POA: Insufficient documentation

## 2021-08-05 NOTE — Progress Notes (Signed)
Subjective:    Patient ID: Jill Meyers, female    DOB: March 06, 1971, 50 y.o.   MRN: [id number]  HPI  Jill Meyers is a very pleasant 50 y.o. female with a who presents today to discuss facial swelling.  Her facial swelling is located to the right, preauricular face which she first noticed about one week ago. She also began to notice right sided dental pain, and felt a "lump" under the skin.   She saw her dentist last week who completed dental xrays, ruled out infection or any dental etiology. She was told that she has been grinding her teeth at night.   She denies erythema or warmth. She has noticed post nasal drip, cough from post nasal drip. Overall has not felt sick. She's noticed a reduction in swelling and size of the lump. Less tender.  She has been under a lot of stress with her father's medical condition.   Review of Systems  HENT:  Positive for postnasal drip.   Respiratory:  Positive for cough.   Skin:  Negative for color change.       Right facial swelling         Past Medical History:  Diagnosis Date  . Allergic rhinitis   . Chickenpox   . Constipation   . Kidney stones   . Panic attacks   . Prediabetes   . UTI (urinary tract infection)     Social History   Socioeconomic History  . Marital status: Married    Spouse name: Not on file  . Number of children: Not on file  . Years of education: Not on file  . Highest education level: Not on file  Occupational History  . Not on file  Tobacco Use  . Smoking status: Never  . Smokeless tobacco: Never  Substance and Sexual Activity  . Alcohol use: Not Currently  . Drug use: Not on file  . Sexual activity: Not on file  Other Topics Concern  . Not on file  Social History Narrative   Married.   Works as a Scientist, clinical (histocompatibility and immunogenetics).    Social Determinants of Health   Financial Resource Strain: Not on file  Food Insecurity: Not on file  Transportation Needs: Not on file  Physical Activity: Not on file  Stress:  Not on file  Social Connections: Not on file  Intimate Partner Violence: Not on file    Past Surgical History:  Procedure Laterality Date  . CHOLECYSTECTOMY  1996  . LITHOTRIPSY      Family History  Problem Relation Age of Onset  . Arthritis Mother   . Hyperlipidemia Mother   . Breast cancer Mother   . Hyperlipidemia Father   . Hypertension Father   . Stroke Father   . Throat cancer Father   . Alcohol abuse Father   . Arthritis Maternal Grandmother   . Hearing loss Maternal Grandmother   . Arthritis Maternal Grandfather   . Heart disease Maternal Grandfather   . Hypertension Maternal Grandfather     Allergies  Allergen Reactions  . Prednisone Swelling    Facial swelling  . Ciprofloxacin Other (See Comments)    Unknown, found from records  . Tramadol Nausea Only    Current Outpatient Medications on File Prior to Visit  Medication Sig Dispense Refill  . phentermine (ADIPEX-P) 37.5 MG tablet Take 37.5 mg by mouth daily before breakfast.     No current facility-administered medications on file prior to visit.    BP 136/80  Pulse 87   Temp 97.8 F (36.6 C) (Temporal)   Ht 5\' 3"  (1.6 m)   Wt 214 lb (97.1 kg)   SpO2 99%   BMI 37.91 kg/m  Objective:   Physical Exam Constitutional:      Appearance: She is not ill-appearing.  HENT:     Head:     Salivary Glands: Right salivary gland is not diffusely enlarged or tender. Left salivary gland is not diffusely enlarged or tender.     Mouth/Throat:     Mouth: Mucous membranes are moist. No oral lesions.     Dentition: No dental abscesses.     Tongue: No lesions.     Pharynx: Oropharynx is clear.     Comments: No apparent Stensen duct blockage Skin:    General: Skin is warm and dry.     Findings: No erythema.     Comments: Soft, immobile, rounded, mildly tender subcutaneous mass to preauricular region of right face. No widespread parotid gland swelling. No erythema. Subcutaneous mass measuring 1 cm.   Neurological:     Mental Status: She is alert.          Assessment & Plan:      This visit occurred during the SARS-CoV-2 public health emergency.  Safety protocols were in place, including screening questions prior to the visit, additional usage of staff PPE, and extensive cleaning of exam room while observing appropriate contact time as indicated for disinfecting solutions.

## 2021-08-05 NOTE — Assessment & Plan Note (Signed)
No obvious parotitis on exam. Mass feels more like a cyst, could be lymph node. She is improving which is reassuring.  Will obtain ultrasound for further insight.  She appears stable today. She will update if symptoms do not continue to improve.

## 2021-08-05 NOTE — Patient Instructions (Signed)
You will be contacted regarding your ultrasound.  Please let us know if you have not been contacted within one week.   It was a pleasure to see you today!    

## 2021-08-18 ENCOUNTER — Other Ambulatory Visit: Payer: Self-pay

## 2021-08-18 ENCOUNTER — Ambulatory Visit
Admission: RE | Admit: 2021-08-18 | Discharge: 2021-08-18 | Disposition: A | Discharge status: Home / Self Care | Payer: Managed Care, Other (non HMO) | Source: Ambulatory Visit | Attending: Primary Care | Admitting: Primary Care

## 2021-08-18 DIAGNOSIS — R229 Localized swelling, mass and lump, unspecified: Secondary | ICD-10-CM | POA: Diagnosis present

## 2021-09-20 HISTORY — PX: OTHER SURGICAL HISTORY: SHX169

## 2022-03-26 ENCOUNTER — Encounter: Payer: Self-pay | Admitting: Primary Care

## 2022-03-26 ENCOUNTER — Ambulatory Visit: Payer: Managed Care, Other (non HMO) | Admitting: Primary Care

## 2022-03-26 VITALS — BP 138/74 | HR 88 | Temp 98.0°F | Ht 63.0 in | Wt 216.0 lb

## 2022-03-26 DIAGNOSIS — I6523 Occlusion and stenosis of bilateral carotid arteries: Secondary | ICD-10-CM | POA: Diagnosis not present

## 2022-03-26 DIAGNOSIS — G8929 Other chronic pain: Secondary | ICD-10-CM | POA: Diagnosis not present

## 2022-03-26 DIAGNOSIS — M2550 Pain in unspecified joint: Secondary | ICD-10-CM | POA: Diagnosis not present

## 2022-03-26 DIAGNOSIS — R42 Dizziness and giddiness: Secondary | ICD-10-CM | POA: Diagnosis not present

## 2022-03-26 LAB — SEDIMENTATION RATE: Sed Rate: 38 mm/hr — ABNORMAL HIGH (ref 0–30)

## 2022-03-26 LAB — BASIC METABOLIC PANEL
BUN: 9 mg/dL (ref 6–23)
CO2: 27 mEq/L (ref 19–32)
Calcium: 9.5 mg/dL (ref 8.4–10.5)
Chloride: 102 mEq/L (ref 96–112)
Creatinine, Ser: 0.54 mg/dL (ref 0.40–1.20)
GFR: 107.21 mL/min (ref >60.00)
Glucose, Bld: 102 mg/dL — ABNORMAL HIGH (ref 70–99)
Potassium: 4 mEq/L (ref 3.5–5.1)
Sodium: 137 mEq/L (ref 135–145)

## 2022-03-26 LAB — LIPID PANEL
Cholesterol: 211 mg/dL — ABNORMAL HIGH (ref 0–200)
HDL: 59.3 mg/dL (ref >39.00)
LDL Cholesterol: 132 mg/dL — ABNORMAL HIGH (ref 0–99)
NonHDL: 151.95
Total CHOL/HDL Ratio: 4
Triglycerides: 100 mg/dL (ref 0.0–149.0)
VLDL: 20 mg/dL (ref 0.0–40.0)

## 2022-03-26 LAB — CBC
HCT: 39.1 % (ref 36.0–46.0)
Hemoglobin: 13.2 g/dL (ref 12.0–15.0)
MCHC: 33.6 g/dL (ref 30.0–36.0)
MCV: 92.7 fl (ref 78.0–100.0)
Platelets: 260 10*3/uL (ref 150.0–400.0)
RBC: 4.22 Mil/uL (ref 3.87–5.11)
RDW: 13.8 % (ref 11.5–15.5)
WBC: 4.2 10*3/uL (ref 4.0–10.5)

## 2022-03-26 LAB — C-REACTIVE PROTEIN: CRP: <1 mg/dL (ref 0.5–20.0)

## 2022-03-26 NOTE — Assessment & Plan Note (Signed)
To multiple sites. Chronic.  Reviewed sports medicine notes and xrays through [facility] Everywhere from June 2023.  Checking labs today to evaluate for RA including RA, CCP, Sed rate.  Encouraged weight loss.

## 2022-03-26 NOTE — Patient Instructions (Signed)
Stop by the lab prior to leaving today. I will notify you of your results once received.   You will be contacted regarding your carotid artery ultrasound.  Please let us know if you have not been contacted within two weeks.   It was a pleasure to see you today!

## 2022-03-26 NOTE — Progress Notes (Signed)
Subjective:    Patient ID: Jill Meyers, female    DOB: 1970/11/19, 51 y.o.   MRN: [id number]  Dizziness Associated symptoms include arthralgias, joint swelling and weakness. Pertinent negatives include no chest pain or headaches.    Jill Meyers is a very pleasant 51 y.o. female with a history of chronic back pain, obesity, chronic right heel pain who presents today to discuss joint pain and dizziness.  1) Joint Pain: Evaluated by Sports Medicine through [facility] on 03/11/22 for acute left knee pain that began the day prior. No improvement with Ibuprofen 800. She underwent xray of the left knee which revealed osteoarthritis of the left patellofemoral joint. She was told by this provider that she should be tested for rheumatoid arthritis. She has a family history of rheumatoid arthritis on her mother's side. She was prescribed Celebrex 200 mg BID.   She continues to experience joint pain to her left knee, lower back, hips, neck, shoulders. She denies ankle and right knee pain. She continues notice pain to the left knee with decrease in ROM.   She went to outpatient PT in Fall 2022, attended two sessions but she could not tolerate the treatment.   2) Dizziness: Symptom onset two weeks ago. Occurs with movement and rest, especially turning her head to the side. Also with bilateral ear ringing that began about the same time.  She also has nasal congestion, post nasal drip. She denies ear pain, sinus pressure, fevers, slurred speech, unilateral weakness. She has a history of vertigo and this does not feel similar. She was admitted in 2016 for hypertensive urgency, left facial numbness. Diagnosed with TIA. Stroke not seen on MRI. Echocardiogram showed mild ventricular hypertrophy but no acute findings.   She's not taken anything OTC for her symptoms. She stopped her Adipex four days ago. She does not check BP at home, but she has her BP checked several times at work which  runs 120's/80's. She drinks at least 64 ounces of water daily.   She has a history of right carotid artery stenosis 40-59% and 1-39% to the left carotid artery. Last cardiac ultrasound was in 2016.   BP Readings from Last 3 Encounters:  03/26/22 138/74  08/05/21 136/80  05/13/21 (!) 146/88      Review of Systems  Cardiovascular:  Negative for chest pain.  Musculoskeletal:  Positive for arthralgias and joint swelling.  Skin:  Negative for color change.  Neurological:  Positive for dizziness and weakness. Negative for headaches.         Past Medical History:  Diagnosis Date   Allergic rhinitis    Chickenpox    Constipation    Kidney stones    Panic attacks    Prediabetes    UTI (urinary tract infection)     Social History   Socioeconomic History   Marital status: Married    Spouse name: Not on file   Number of children: Not on file   Years of education: Not on file   Highest education level: Not on file  Occupational History   Not on file  Tobacco Use   Smoking status: Never   Smokeless tobacco: Never  Substance and Sexual Activity   Alcohol use: Not Currently   Drug use: Not on file   Sexual activity: Not on file  Other Topics Concern   Not on file  Social History Narrative   Married.   Works as a Scientist, clinical (histocompatibility and immunogenetics).    Social Determinants of  Health   Financial Resource Strain: Not on file  Food Insecurity: Not on file  Transportation Needs: Not on file  Physical Activity: Not on file  Stress: Not on file  Social Connections: Not on file  Intimate Partner Violence: Not on file    Past Surgical History:  Procedure Laterality Date   CHOLECYSTECTOMY  1996   LITHOTRIPSY      Family History  Problem Relation Age of Onset   Arthritis Mother    Hyperlipidemia Mother    Breast cancer Mother    Hyperlipidemia Father    Hypertension Father    Stroke Father    Throat cancer Father    Alcohol abuse Father    Arthritis Maternal Grandmother     Hearing loss Maternal Grandmother    Arthritis Maternal Grandfather    Heart disease Maternal Grandfather    Hypertension Maternal Grandfather     Allergies  Allergen Reactions   Prednisone Swelling    Facial swelling   Ciprofloxacin Other (See Comments)    Unknown, found from records   Tramadol Nausea Only    Current Outpatient Medications on File Prior to Visit  Medication Sig Dispense Refill   celecoxib (CELEBREX) 200 MG capsule Take by mouth. (Patient not taking: Reported on 03/26/2022)     phentermine (ADIPEX-P) 37.5 MG tablet Take 37.5 mg by mouth daily before breakfast. (Patient not taking: Reported on 03/26/2022)     No current facility-administered medications on file prior to visit.    BP 138/74   Pulse 88   Temp 98 F (36.7 C) (Oral)   Ht 5\' 3"  (1.6 m)   Wt 216 lb (98 kg)   SpO2 100%   BMI 38.26 kg/m  Objective:   Physical Exam Constitutional:      General: She is not in acute distress. HENT:     Right Ear: Tympanic membrane and ear canal normal. There is no impacted cerumen.     Left Ear: Ear canal normal. There is no impacted cerumen.     Ears:     Comments: Cloudy appearance to left TM. Eyes:     Extraocular Movements: Extraocular movements intact.  Neck:     Vascular: No carotid bruit.  Cardiovascular:     Rate and Rhythm: Normal rate and regular rhythm.     Heart sounds: Normal heart sounds. No murmur heard. Pulmonary:     Effort: Pulmonary effort is normal.     Breath sounds: Normal breath sounds.  Musculoskeletal:     Cervical back: Neck supple.  Skin:    General: Skin is warm and dry.  Neurological:     Mental Status: She is alert and oriented to person, place, and time.     Cranial Nerves: No cranial nerve deficit.     Coordination: Coordination normal.     Comments: Ambulates well in the office  Psychiatric:        Mood and Affect: Mood normal.           Assessment & Plan:   Problem List Items Addressed This Visit       Other    Chronic joint pain - Primary    To multiple sites. Chronic.  Reviewed sports medicine notes and xrays through [facility] from June 2023.  Checking labs today to evaluate for RA including RA, CCP, Sed rate.  Encouraged weight loss.        Relevant Medications   celecoxib (CELEBREX) 200 MG capsule   Other Relevant Orders  C-reactive protein   Cyclic citrul peptide antibody, IgG   Rheumatoid factor   Sedimentation rate   Dizziness    Acute onset.  No alarm signs on exam today.  Orthostatic vitals today negative. Checking labs today include CBC, TSH, BMP.  Reviewed carotid ultrasounds from 2016 through [facility]. Reviewed MRI and MRA from 2016 through [facility]. Repeat carotid ultrasound due and pending.  Start Claritin daily. Start Flonase daily.  Consider CT head.  Await results.  Strict ED precautions provided.       Relevant Orders   CBC   Basic metabolic panel   Lipid panel   Other Visit Diagnoses     Bilateral carotid artery stenosis       Relevant Orders   VAS US CAROTID          Doreene Nest, NP

## 2022-03-26 NOTE — Assessment & Plan Note (Addendum)
Acute onset.  No alarm signs on exam today.  Orthostatic vitals today negative. Checking labs today include CBC, TSH, BMP.  Reviewed carotid ultrasounds from 2016 through [facility]. Reviewed MRI and MRA from 2016 through [facility]. Repeat carotid ultrasound due and pending.  Start Claritin daily. Start Flonase daily.  Consider CT head.  Await results.  Strict ED precautions provided.

## 2022-03-29 LAB — RHEUMATOID FACTOR: Rheumatoid fact SerPl-aCnc: <14 IU/mL (ref <14)

## 2022-03-29 LAB — CYCLIC CITRUL PEPTIDE ANTIBODY, IGG: Cyclic Citrullin Peptide Ab: <16 UNITS

## 2022-03-31 ENCOUNTER — Ambulatory Visit (INDEPENDENT_AMBULATORY_CARE_PROVIDER_SITE_OTHER): Payer: Managed Care, Other (non HMO) | Admitting: Primary Care

## 2022-03-31 ENCOUNTER — Other Ambulatory Visit (HOSPITAL_COMMUNITY)
Admission: RE | Admit: 2022-03-31 | Discharge: 2022-03-31 | Disposition: A | Discharge status: Home / Self Care | Payer: Managed Care, Other (non HMO) | Source: Ambulatory Visit | Attending: Primary Care | Admitting: Primary Care

## 2022-03-31 ENCOUNTER — Encounter: Payer: Self-pay | Admitting: Primary Care

## 2022-03-31 VITALS — BP 120/78 | HR 91 | Temp 98.6°F | Ht 63.0 in | Wt 217.0 lb

## 2022-03-31 DIAGNOSIS — Z124 Encounter for screening for malignant neoplasm of cervix: Secondary | ICD-10-CM

## 2022-03-31 DIAGNOSIS — R42 Dizziness and giddiness: Secondary | ICD-10-CM | POA: Diagnosis not present

## 2022-03-31 DIAGNOSIS — Z0001 Encounter for general adult medical examination with abnormal findings: Secondary | ICD-10-CM

## 2022-03-31 DIAGNOSIS — Z1211 Encounter for screening for malignant neoplasm of colon: Secondary | ICD-10-CM

## 2022-03-31 DIAGNOSIS — M2550 Pain in unspecified joint: Secondary | ICD-10-CM | POA: Diagnosis not present

## 2022-03-31 DIAGNOSIS — F4323 Adjustment disorder with mixed anxiety and depressed mood: Secondary | ICD-10-CM | POA: Diagnosis not present

## 2022-03-31 DIAGNOSIS — G8929 Other chronic pain: Secondary | ICD-10-CM

## 2022-03-31 MED ORDER — SERTRALINE HCL 25 MG PO TABS: 25.0000 mg | ORAL_TABLET | Freq: Every day | ORAL | 0 refills | Status: DC

## 2022-03-31 NOTE — Assessment & Plan Note (Signed)
Recent work up negative for Rheumatoid Arthritis. Discussed this with patient today.   Following with sports medicine.

## 2022-03-31 NOTE — Progress Notes (Signed)
Subjective:    Patient ID: Jill Meyers, female    DOB: Apr 08, 1971, 51 y.o.   MRN: [id number]  HPI  Jill Meyers is a very pleasant 51 y.o. female who presents today for complete physical and follow up of chronic conditions.  She would also like to discuss symptoms of anxiety and depression. Prior history of anxiety years ago, was prescribed Xanax years ago. Symptoms now include worrying, feeling anxious, irritability, tearfulness, forgetfulness, not wanting to do anything which have gradually progressed since 2019. She's under a lot of stress a several of her family members have poor health. Menstrual cycles have been irregular, she suspects she's going perimenopause.       03/31/2022   11:41 AM 05/13/2021    3:23 PM  PHQ9 SCORE ONLY  PHQ-9 Total Score 18 11    Immunizations: -Tetanus: 2018 -Influenza: Did not complete last season  -Covid-19: 2 vaccines  -Shingles: Never completed  Diet: Fair diet.  Exercise: No regular exercise.  Eye exam: Completes annually  Dental exam: Completes semi-annually   Pap Smear: Completed in 2018 Mammogram: Completed in August 2022  Colonoscopy: Never completed    BP Readings from Last 3 Encounters:  03/31/22 120/78  03/26/22 138/74  08/05/21 136/80       Review of Systems  Constitutional:  Negative for unexpected weight change.  HENT:  Negative for rhinorrhea.   Respiratory:  Negative for cough and shortness of breath.   Cardiovascular:  Negative for chest pain.  Gastrointestinal:  Negative for constipation and diarrhea.  Genitourinary:  Negative for difficulty urinating.  Musculoskeletal:  Positive for arthralgias.  Skin:  Negative for rash.  Allergic/Immunologic: Negative for environmental allergies.  Neurological:  Positive for dizziness. Negative for headaches.  Psychiatric/Behavioral:  The patient is nervous/anxious.        See HPI         Past Medical History:  Diagnosis Date   Allergic rhinitis    Chickenpox     Constipation    Kidney stones    Panic attacks    Prediabetes    UTI (urinary tract infection)     Social History   Socioeconomic History   Marital status: Married    Spouse name: Not on file   Number of children: Not on file   Years of education: Not on file   Highest education level: Not on file  Occupational History   Not on file  Tobacco Use   Smoking status: Never   Smokeless tobacco: Never  Substance and Sexual Activity   Alcohol use: Not Currently   Drug use: Not on file   Sexual activity: Not on file  Other Topics Concern   Not on file  Social History Narrative   Married.   Works as a Scientist, clinical (histocompatibility and immunogenetics).    Social Determinants of Health   Financial Resource Strain: Not on file  Food Insecurity: Not on file  Transportation Needs: Not on file  Physical Activity: Not on file  Stress: Not on file  Social Connections: Not on file  Intimate Partner Violence: Not on file    Past Surgical History:  Procedure Laterality Date   CHOLECYSTECTOMY  1996   LITHOTRIPSY      Family History  Problem Relation Age of Onset   Arthritis Mother    Hyperlipidemia Mother    Breast cancer Mother    Hyperlipidemia Father    Hypertension Father    Stroke Father    Throat cancer Father    Alcohol abuse  Father    Arthritis Maternal Grandmother    Hearing loss Maternal Grandmother    Arthritis Maternal Grandfather    Heart disease Maternal Grandfather    Hypertension Maternal Grandfather     Allergies  Allergen Reactions   Prednisone Swelling    Facial swelling   Amoxicillin Other (See Comments)    Causes UTI    Ciprofloxacin Other (See Comments)    Unknown, found from records   Tramadol Nausea Only    Current Outpatient Medications on File Prior to Visit  Medication Sig Dispense Refill   fluticasone (FLONASE) 50 MCG/ACT nasal spray Place into both nostrils daily.     loratadine (CLARITIN) 10 MG tablet Take 10 mg by mouth daily.     celecoxib (CELEBREX) 200 MG  capsule Take by mouth. (Patient not taking: Reported on 03/26/2022)     No current facility-administered medications on file prior to visit.    BP 120/78   Pulse 91   Temp 98.6 F (37 C) (Oral)   Ht 5\' 3"  (1.6 m)   Wt 217 lb (98.4 kg)   SpO2 99%   BMI 38.44 kg/m  Objective:   Physical Exam HENT:     Right Ear: Tympanic membrane and ear canal normal.     Left Ear: Tympanic membrane and ear canal normal.     Nose: Nose normal.  Eyes:     Conjunctiva/sclera: Conjunctivae normal.     Pupils: Pupils are equal, round, and reactive to light.  Neck:     Thyroid: No thyromegaly.  Cardiovascular:     Rate and Rhythm: Normal rate and regular rhythm.     Heart sounds: No murmur heard. Pulmonary:     Effort: Pulmonary effort is normal.     Breath sounds: Normal breath sounds. No rales.  Abdominal:     General: Bowel sounds are normal.     Palpations: Abdomen is soft.     Tenderness: There is no abdominal tenderness.  Genitourinary:    Labia:        Right: No tenderness or lesion.        Left: No tenderness or lesion.      Vagina: No vaginal discharge.     Cervix: No cervical motion tenderness or discharge.     Uterus: Normal.      Adnexa: Right adnexa normal and left adnexa normal.  Musculoskeletal:        General: Normal range of motion.     Cervical back: Neck supple.  Lymphadenopathy:     Cervical: No cervical adenopathy.  Skin:    General: Skin is warm and dry.     Findings: No rash.  Neurological:     Mental Status: She is alert and oriented to person, place, and time.     Cranial Nerves: No cranial nerve deficit.     Deep Tendon Reflexes: Reflexes are normal and symmetric.  Psychiatric:        Mood and Affect: Mood normal.           Assessment & Plan:   Problem List Items Addressed This Visit       Other   Chronic joint pain    Recent work up negative for Rheumatoid Arthritis. Discussed this with patient today.   Following with sports medicine.        Relevant Medications   sertraline (ZOLOFT) 25 MG tablet   Dizziness    Continued. Not as dizzy today on exam.  Continue Flonase and Claritin.  She will update      Adjustment reaction with anxiety and depression    Chronic, progressing.   Discussed options for treatment.   Start Zoloft 25 mg daily.  We discussed possible side effects of headache, GI upset, drowsiness, and SI/HI. If thoughts of SI/HI develop, we discussed to present to the emergency immediately. Patient verbalized understanding.   Follow up in 6 weeks for re-evaluation.        Relevant Medications   sertraline (ZOLOFT) 25 MG tablet   Encounter for annual general medical examination with abnormal findings in adult - Primary    Due for Shingrix vaccines, she declines today. Other vaccines UTD. Pap smear due, completed today. Colonoscopy due, referral placed to GI.   Discussed the importance of a healthy diet and regular exercise in order for weight loss, and to reduce the risk of further co-morbidity.  Exam today as noted. Labs reviewed.        Other Visit Diagnoses     Screening for colon cancer       Relevant Orders   Ambulatory referral to Gastroenterology   Screening for cervical cancer       Relevant Orders   Cytology - PAP          Doreene Nest, NP

## 2022-03-31 NOTE — Assessment & Plan Note (Signed)
Due for Shingrix vaccines, she declines today. Other vaccines UTD. Pap smear due, completed today. Colonoscopy due, referral placed to GI.   Discussed the importance of a healthy diet and regular exercise in order for weight loss, and to reduce the risk of further co-morbidity.  Exam today as noted. Labs reviewed.

## 2022-03-31 NOTE — Assessment & Plan Note (Addendum)
Chronic, progressing.   Discussed options for treatment.   Start Zoloft 25 mg daily.  We discussed possible side effects of headache, GI upset, drowsiness, and SI/HI. If thoughts of SI/HI develop, we discussed to present to the emergency immediately. Patient verbalized understanding.   Follow up in 6 weeks for re-evaluation.

## 2022-03-31 NOTE — Assessment & Plan Note (Addendum)
Continued. Not as dizzy today on exam.  Continue Flonase and Claritin.  She will update

## 2022-03-31 NOTE — Patient Instructions (Signed)
Start sertraline (Zoloft) 25 mg for anxiety and depression. Take 1 tablet by mouth once daily.  You will be contacted regarding your referral to GI for the colonoscopy.  Please let us know if you have not been contacted within two weeks.   Please schedule a follow up visit for 6 weeks for follow up of anxiety/depression.  It was a pleasure to see you today!  Preventive Care 41-51 Years Old, Female Preventive care refers to lifestyle choices and visits with your health care provider that can promote health and wellness. Preventive care visits are also called wellness exams. What can I expect for my preventive care visit? Counseling Your health care provider may ask you questions about your: Medical history, including: Past medical problems. Family medical history. Pregnancy history. Current health, including: Menstrual cycle. Method of birth control. Emotional well-being. Home life and relationship well-being. Sexual activity and sexual health. Lifestyle, including: Alcohol, nicotine or tobacco, and drug use. Access to firearms. Diet, exercise, and sleep habits. Work and work Statistician. Sunscreen use. Safety issues such as seatbelt and bike helmet use. Physical exam Your health care provider will check your: Height and weight. These may be used to calculate your BMI (body mass index). BMI is a measurement that tells if you are at a healthy weight. Waist circumference. This measures the distance around your waistline. This measurement also tells if you are at a healthy weight and may help predict your risk of certain diseases, such as type 2 diabetes and high blood pressure. Heart rate and blood pressure. Body temperature. Skin for abnormal spots. What immunizations do I need?  Vaccines are usually given at various ages, according to a schedule. Your health care provider will recommend vaccines for you based on your age, medical history, and lifestyle or other factors, such as  travel or where you work. What tests do I need? Screening Your health care provider may recommend screening tests for certain conditions. This may include: Lipid and cholesterol levels. Diabetes screening. This is done by checking your blood sugar (glucose) after you have not eaten for a while (fasting). Pelvic exam and Pap test. Hepatitis B test. Hepatitis C test. HIV (human immunodeficiency virus) test. STI (sexually transmitted infection) testing, if you are at risk. Lung cancer screening. Colorectal cancer screening. Mammogram. Talk with your health care provider about when you should start having regular mammograms. This may depend on whether you have a family history of breast cancer. BRCA-related cancer screening. This may be done if you have a family history of breast, ovarian, tubal, or peritoneal cancers. Bone density scan. This is done to screen for osteoporosis. Talk with your health care provider about your test results, treatment options, and if necessary, the need for more tests. Follow these instructions at home: Eating and drinking  Eat a diet that includes fresh fruits and vegetables, whole grains, lean protein, and low-fat dairy products. Take vitamin and mineral supplements as recommended by your health care provider. Do not drink alcohol if: Your health care provider tells you not to drink. You are pregnant, may be pregnant, or are planning to become pregnant. If you drink alcohol: Limit how much you have to 0-1 drink a day. Know how much alcohol is in your drink. In the [place] one drink equals one 12 oz bottle of beer (355 mL), one 5 oz glass of wine (148 mL), or one 1 oz glass of hard liquor (44 mL). Lifestyle Brush your teeth every morning and night with fluoride toothpaste. Floss  one time each day. Exercise for at least 30 minutes 5 or more days each week. Do not use any products that contain nicotine or tobacco. These products include cigarettes, chewing  tobacco, and vaping devices, such as e-cigarettes. If you need help quitting, ask your health care provider. Do not use drugs. If you are sexually active, practice safe sex. Use a condom or other form of protection to prevent STIs. If you do not wish to become pregnant, use a form of birth control. If you plan to become pregnant, see your health care provider for a prepregnancy visit. Take aspirin only as told by your health care provider. Make sure that you understand how much to take and what form to take. Work with your health care provider to find out whether it is safe and beneficial for you to take aspirin daily. Find healthy ways to manage stress, such as: Meditation, yoga, or listening to music. Journaling. Talking to a trusted person. Spending time with friends and family. Minimize exposure to UV radiation to reduce your risk of skin cancer. Safety Always wear your seat belt while driving or riding in a vehicle. Do not drive: If you have been drinking alcohol. Do not ride with someone who has been drinking. When you are tired or distracted. While texting. If you have been using any mind-altering substances or drugs. Wear a helmet and other protective equipment during sports activities. If you have firearms in your house, make sure you follow all gun safety procedures. Seek help if you have been physically or sexually abused. What's next? Visit your health care provider once a year for an annual wellness visit. Ask your health care provider how often you should have your eyes and teeth checked. Stay up to date on all vaccines. This information is not intended to replace advice given to you by your health care provider. Make sure you discuss any questions you have with your health care provider. Document Revised: 03/04/2021 Document Reviewed: 03/04/2021 Elsevier Patient Education  [place].

## 2022-04-01 ENCOUNTER — Telehealth: Payer: Self-pay

## 2022-04-01 NOTE — Telephone Encounter (Signed)
Patient having another procedure done will call us back after to schedule

## 2022-04-06 DIAGNOSIS — I6523 Occlusion and stenosis of bilateral carotid arteries: Secondary | ICD-10-CM

## 2022-04-06 NOTE — Telephone Encounter (Signed)
Jae Dire please advise on the medications  Jill Meyers: do we need to do something for her u/s?

## 2022-04-07 DIAGNOSIS — I6523 Occlusion and stenosis of bilateral carotid arteries: Secondary | ICD-10-CM

## 2022-04-07 NOTE — Telephone Encounter (Signed)
Pt called in regarding Ultra sound stated she need the referral to be for [facility] in [place] because insurance will not cover it . Will also need the CPT code to know the total cost to pay out of pocket . Please Advise [phone]

## 2022-04-07 NOTE — Telephone Encounter (Signed)
Jill Meyers, FYI regarding new place for Carotid US.

## 2022-04-08 LAB — CYTOLOGY - PAP
Comment: NEGATIVE
Diagnosis: UNDETERMINED — AB
High risk HPV: NEGATIVE

## 2022-04-09 ENCOUNTER — Telehealth: Payer: Self-pay | Admitting: Primary Care

## 2022-04-09 NOTE — Telephone Encounter (Signed)
Forwarding

## 2022-04-09 NOTE — Telephone Encounter (Signed)
Patient called in stating [facility] has not received her referral for an [place]. Patient stated that the referral can be faxed over to [phone] or [phone]. Thank you!

## 2022-04-12 ENCOUNTER — Telehealth: Payer: Self-pay | Admitting: Primary Care

## 2022-04-12 NOTE — Telephone Encounter (Signed)
Patient called and stated she needs her pap test faxed to [phone] Doctor Juliene Pina, [facility] in [place]. Call back number [phone].

## 2022-04-12 NOTE — Telephone Encounter (Signed)
Patient called in regarding her referral not being sent over to [facility]. Needs it to be sent over to get scheduled as her dad was recently diagnosed with cancer and she will be in [place] for some time and not sure when she will return. Fax over to [phone] and [phone], make sure that its clear as they are having issues with there fax. Thank you!

## 2022-04-13 NOTE — Telephone Encounter (Signed)
No referral to gyn do we need to get patient to fill out ROI?

## 2022-04-13 NOTE — Telephone Encounter (Signed)
Amy, is this the order you were faxing over today? The order is for the [facility] center.

## 2022-04-13 NOTE — Telephone Encounter (Signed)
Faxed over last pap results to Dr. Juliene Pina at [phone] After receiving a medical release from the patient on 7.25.23  Patient is scheduling her own appointment

## 2022-04-13 NOTE — Telephone Encounter (Signed)
Faxed over patient's referral to [facility] at fax number [phone], gave CPT code 63846

## 2022-04-14 NOTE — Telephone Encounter (Signed)
Called patient states that she was told by his office that it could take 48 hours to process. If she does not get call from them in this week she will let us know and we will re fax.

## 2022-04-15 ENCOUNTER — Ambulatory Visit: Payer: Managed Care, Other (non HMO) | Admitting: Primary Care

## 2022-04-16 ENCOUNTER — Ambulatory Visit: Payer: Managed Care, Other (non HMO) | Admitting: Primary Care

## 2022-05-12 ENCOUNTER — Ambulatory Visit: Payer: Managed Care, Other (non HMO) | Admitting: Primary Care

## 2022-05-12 ENCOUNTER — Encounter: Payer: Self-pay | Admitting: Primary Care

## 2022-05-12 VITALS — BP 126/78 | HR 98 | Temp 98.6°F | Ht 63.0 in | Wt 220.0 lb

## 2022-05-12 DIAGNOSIS — F4323 Adjustment disorder with mixed anxiety and depressed mood: Secondary | ICD-10-CM | POA: Diagnosis not present

## 2022-05-12 DIAGNOSIS — R0683 Snoring: Secondary | ICD-10-CM | POA: Diagnosis not present

## 2022-05-12 NOTE — Progress Notes (Signed)
Subjective:    Patient ID: Jill Meyers, female    DOB: 08-Nov-1970, 51 y.o.   MRN: [id number]  Depression        Associated symptoms include no headaches.   Jill Meyers is a very pleasant 51 y.o. female with a history of chronic back pain, chronic joint pain, dizziness, anxiety and depression who presents today for follow up of anxiety and depression and to discuss snoring/daytime fatigue.  1) Anxiety and Depression: She was last evaluated on 03/31/2022 for her routine physical when she mentioned symptoms of anxiety and depression.  Symptoms included worrying, feeling anxious, irritability, tearfulness, forgetfulness.  During this visit we initiated Zoloft milligrams daily and asked her to follow-up 6 weeks later.  Since her last visit she's feeling much better. She is using a meditation APP daily for which she downloaded on her phone. She's also cut back on caffeine and salt. She never took Zoloft. Her husband recently purchased a motorcycle for which she enjoys riding.   2) Snoring/Daytime Fatigue: Chronic snoring for years. She will wake herself from sleep having to urinate. She wakes up feeling very tired despite a full night's sleep. She does feel that she could doze off during the day, especially at work. She looks forward to her break at work so that she can nap quickly.   She questions if she has sleep apnea. Her husband has never noticed periods of apnea during her sleep.   BP Readings from Last 3 Encounters:  05/12/22 126/78  03/31/22 120/78  03/26/22 138/74      Review of Systems  Respiratory:  Negative for shortness of breath.   Cardiovascular:  Negative for chest pain.  Neurological:  Negative for headaches.  Psychiatric/Behavioral:  Positive for depression. The patient is not nervous/anxious.        See HPI         Past Medical History:  Diagnosis Date   Allergic rhinitis    Chickenpox    Constipation    Kidney stones    Panic attacks    Prediabetes     UTI (urinary tract infection)     Social History   Socioeconomic History   Marital status: Married    Spouse name: Not on file   Number of children: Not on file   Years of education: Not on file   Highest education level: Not on file  Occupational History   Not on file  Tobacco Use   Smoking status: Never   Smokeless tobacco: Never  Substance and Sexual Activity   Alcohol use: Not Currently   Drug use: Not on file   Sexual activity: Not on file  Other Topics Concern   Not on file  Social History Narrative   Married.   Works as a Scientist, clinical (histocompatibility and immunogenetics).    Social Determinants of Health   Financial Resource Strain: Not on file  Food Insecurity: Not on file  Transportation Needs: Not on file  Physical Activity: Not on file  Stress: Not on file  Social Connections: Not on file  Intimate Partner Violence: Not on file    Past Surgical History:  Procedure Laterality Date   CHOLECYSTECTOMY  1996   LITHOTRIPSY      Family History  Problem Relation Age of Onset   Arthritis Mother    Hyperlipidemia Mother    Breast cancer Mother    Hyperlipidemia Father    Hypertension Father    Stroke Father    Throat cancer Father    Alcohol  abuse Father    Arthritis Maternal Grandmother    Hearing loss Maternal Grandmother    Arthritis Maternal Grandfather    Heart disease Maternal Grandfather    Hypertension Maternal Grandfather     Allergies  Allergen Reactions   Prednisone Swelling    Facial swelling   Amoxicillin Other (See Comments)    Causes UTI    Ciprofloxacin Other (See Comments)    Unknown, found from records   Tramadol Nausea Only    Current Outpatient Medications on File Prior to Visit  Medication Sig Dispense Refill   fluticasone (FLONASE) 50 MCG/ACT nasal spray Place into both nostrils daily.     loratadine (CLARITIN) 10 MG tablet Take 10 mg by mouth daily.     No current facility-administered medications on file prior to visit.    BP 126/78   Pulse 98    Temp 98.6 F (37 C) (Oral)   Ht 5\' 3"  (1.6 m)   Wt 220 lb (99.8 kg)   SpO2 100%   BMI 38.97 kg/m  Objective:   Physical Exam Cardiovascular:     Rate and Rhythm: Normal rate and regular rhythm.  Pulmonary:     Effort: Pulmonary effort is normal.     Breath sounds: Normal breath sounds.  Musculoskeletal:     Cervical back: Neck supple.  Skin:    General: Skin is warm and dry.  Psychiatric:        Mood and Affect: Mood normal.     Comments: Improved mood today           Assessment & Plan:   Problem List Items Addressed This Visit       Other   Adjustment reaction with anxiety and depression    Improving.  Continue self calming techniques, reduction of caffeine, meditation. Remain off Zoloft.   Continue to monitor.       Snoring - Primary    Suspicious for sleep apnea. Referral placed for pulmonology for evaluation.       Relevant Orders   Ambulatory referral to Pulmonology       , NP

## 2022-05-12 NOTE — Assessment & Plan Note (Signed)
Improving.  Continue self calming techniques, reduction of caffeine, meditation. Remain off Zoloft.   Continue to monitor.

## 2022-05-12 NOTE — Patient Instructions (Addendum)
Call [facility] to schedule your colonoscopy. [phone]  Continue to work on meditation and self calming techniques.   You will be contacted regarding your referral to pulmonology.  Please let us know if you have not been contacted within two weeks.   It was a pleasure to see you today!

## 2022-05-12 NOTE — Assessment & Plan Note (Signed)
Suspicious for sleep apnea. Referral placed for pulmonology for evaluation.

## 2022-05-21 NOTE — Telephone Encounter (Signed)
Have you received any notes back on this Carotid US? I do not see anything scanned in  Per the notes it looks like Amy faxed everything over to them in July.   Just following up

## 2022-05-21 NOTE — Telephone Encounter (Signed)
Results are in care everywhere. Patient had done on 04/19/2022.

## 2022-07-08 ENCOUNTER — Encounter: Payer: Self-pay | Admitting: Internal Medicine

## 2022-07-08 ENCOUNTER — Ambulatory Visit: Payer: Managed Care, Other (non HMO) | Admitting: Internal Medicine

## 2022-07-08 DIAGNOSIS — H9319 Tinnitus, unspecified ear: Secondary | ICD-10-CM | POA: Diagnosis not present

## 2022-07-08 NOTE — Assessment & Plan Note (Signed)
No vertigo or hearing loss Discussed that allergies usually don't cause this--but she is better with the flonase and claritin Has touch of sinus headache--also improving Could consider decongestant if ongoing drainage ENT if persistent symptoms

## 2022-07-08 NOTE — Progress Notes (Signed)
Subjective:    Patient ID: Jill Meyers, female    DOB: Dec 26, 1970, 51 y.o.   MRN: [id number]  HPI Here due to ringing in ears Noticed it a few days ago--very high pitched sound Worsening and pulsatile Also with headaches  This has happened in the past Seemed to be allergy related Does have post nasal drip, etc  Checked BP 158/100 yesterday---then repeat 157/98--139/86  Tried flonase and claritin Seems to be improving  No hearing loss No balance problems or dizziness  Current Outpatient Medications on File Prior to Visit  Medication Sig Dispense Refill   fluticasone (FLONASE) 50 MCG/ACT nasal spray Place into both nostrils daily.     ibuprofen (ADVIL) 800 MG tablet Take by mouth.     loratadine (CLARITIN) 10 MG tablet Take 10 mg by mouth daily.     No current facility-administered medications on file prior to visit.    Allergies  Allergen Reactions   Prednisone Swelling    Facial swelling   Amoxicillin Other (See Comments)    Causes UTI    Ciprofloxacin Other (See Comments)    Unknown, found from records   Tramadol Nausea Only    Past Medical History:  Diagnosis Date   Allergic rhinitis    Chickenpox    Constipation    Kidney stones    Panic attacks    Prediabetes    UTI (urinary tract infection)     Past Surgical History:  Procedure Laterality Date   CHOLECYSTECTOMY  1996   LITHOTRIPSY      Family History  Problem Relation Age of Onset   Arthritis Mother    Hyperlipidemia Mother    Breast cancer Mother    Hyperlipidemia Father    Hypertension Father    Stroke Father    Throat cancer Father    Alcohol abuse Father    Arthritis Maternal Grandmother    Hearing loss Maternal Grandmother    Arthritis Maternal Grandfather    Heart disease Maternal Grandfather    Hypertension Maternal Grandfather     Social History   Socioeconomic History   Marital status: Married    Spouse name: Not on file   Number of children: Not on file   Years of  education: Not on file   Highest education level: Not on file  Occupational History   Not on file  Tobacco Use   Smoking status: Never   Smokeless tobacco: Never  Substance and Sexual Activity   Alcohol use: Not Currently   Drug use: Not on file   Sexual activity: Not on file  Other Topics Concern   Not on file  Social History Narrative   Married.   Works as a Scientist, clinical (histocompatibility and immunogenetics).    Social Determinants of Health   Financial Resource Strain: Not on file  Food Insecurity: Not on file  Transportation Needs: Not on file  Physical Activity: Not on file  Stress: Not on file  Social Connections: Not on file  Intimate Partner Violence: Not on file   Review of Systems Recent biopsy of uterine polyps --last week 10/11 Still some mild bleeding from this Having a lot of stress---family health issues    Objective:   Physical Exam Constitutional:      Appearance: Normal appearance.  HENT:     Right Ear: Tympanic membrane and ear canal normal.     Left Ear: Tympanic membrane and ear canal normal.  Eyes:     Comments: No nystagmus  Neurological:  Mental Status: She is alert.     Comments: Normal gait Romberg absent            Assessment & Plan:

## 2022-07-23 NOTE — Telephone Encounter (Signed)
Carotid US done 04/19/2022 With [facility] in [place] - results in [place]

## 2022-07-26 ENCOUNTER — Ambulatory Visit: Payer: Managed Care, Other (non HMO) | Admitting: Primary Care

## 2022-07-26 ENCOUNTER — Encounter: Payer: Self-pay | Admitting: Primary Care

## 2022-07-26 VITALS — BP 132/84 | HR 90 | Temp 97.9°F | Ht 63.0 in | Wt 226.0 lb

## 2022-07-26 DIAGNOSIS — M2550 Pain in unspecified joint: Secondary | ICD-10-CM

## 2022-07-26 DIAGNOSIS — R300 Dysuria: Secondary | ICD-10-CM | POA: Diagnosis not present

## 2022-07-26 DIAGNOSIS — M5442 Lumbago with sciatica, left side: Secondary | ICD-10-CM

## 2022-07-26 DIAGNOSIS — G8929 Other chronic pain: Secondary | ICD-10-CM | POA: Diagnosis not present

## 2022-07-26 LAB — POC URINALSYSI DIPSTICK (AUTOMATED)
Bilirubin, UA: NEGATIVE
Blood, UA: NEGATIVE
Glucose, UA: NEGATIVE
Ketones, UA: NEGATIVE
Leukocytes, UA: NEGATIVE
Nitrite, UA: NEGATIVE
Protein, UA: NEGATIVE
Spec Grav, UA: 1.015 (ref 1.010–1.025)
Urobilinogen, UA: 0.2 E.U./dL
pH, UA: 6 (ref 5.0–8.0)

## 2022-07-26 MED ORDER — DULOXETINE HCL 20 MG PO CPEP: 20.0000 mg | ORAL_CAPSULE | Freq: Every day | ORAL | 0 refills | Status: DC

## 2022-07-26 NOTE — Progress Notes (Signed)
Subjective:    Patient ID: Jill Meyers, female    DOB: 07-10-1971, 51 y.o.   MRN: [id number]  Muscle Pain Associated symptoms include dysuria. Pertinent negatives include no vaginal discharge.    Jill Meyers is a very pleasant 51 y.o. female with a history of chronic back pain, obesity, renal stones, chronic joint pain, anxiety with depression who presents today to discuss joint and muscle pain.  Chronic back pain and joint/muscle pain for years. Joint pain is generalized "I have pain over my entire body". Her pain today is worse to the left lower back which began about two weeks ago. Four days ago she began to experience burning with urination and radiation of pain to her left groin which caused her to worry.   Most of her pain occurs to her neck, fingers, hips, thighs. She has chronic numbness to her left lower extremity to the thigh.   She began increasing water intake and her dysuria has improved.   She underwent plain films of the lumbar spine in January 2021 which showed constipation but was otherwise normal.   She denies injury/trauma, radiation of pain down her lower extremities. She was following with a chiropractor for hip pain, no recent visit.   She tested negative for rheumatoid arthritis in July 2023. She applies Voltaren Gel to her knee and hips. She questions if she has fibromyalgia.   Wt Readings from Last 3 Encounters:  07/26/22 226 lb (102.5 kg)  07/08/22 227 lb (103 kg)  05/12/22 220 lb (99.8 kg)      Review of Systems  Genitourinary:  Positive for dysuria. Negative for frequency, pelvic pain and vaginal discharge.  Musculoskeletal:  Positive for arthralgias and back pain.  Neurological:  Negative for numbness.         Past Medical History:  Diagnosis Date   Allergic rhinitis    Chickenpox    Constipation    Kidney stones    Panic attacks    Prediabetes    UTI (urinary tract infection)     Social History   Socioeconomic History   Marital  status: Married    Spouse name: Not on file   Number of children: Not on file   Years of education: Not on file   Highest education level: Not on file  Occupational History   Not on file  Tobacco Use   Smoking status: Never   Smokeless tobacco: Never  Substance and Sexual Activity   Alcohol use: Not Currently   Drug use: Not on file   Sexual activity: Not on file  Other Topics Concern   Not on file  Social History Narrative   Married.   Works as a Environmental manager.    Social Determinants of Health   Financial Resource Strain: Not on file  Food Insecurity: Not on file  Transportation Needs: Not on file  Physical Activity: Not on file  Stress: Not on file  Social Connections: Not on file  Intimate Partner Violence: Not on file    Past Surgical History:  Procedure Laterality Date   CHOLECYSTECTOMY  1996   LITHOTRIPSY     uterine polyp removal  2023    Family History  Problem Relation Age of Onset   Arthritis Mother    Hyperlipidemia Mother    Breast cancer Mother    Hyperlipidemia Father    Hypertension Father    Stroke Father    Throat cancer Father    Alcohol abuse Father    Arthritis Maternal  Grandmother    Hearing loss Maternal Grandmother    Arthritis Maternal Grandfather    Heart disease Maternal Grandfather    Hypertension Maternal Grandfather     Allergies  Allergen Reactions   Prednisone Swelling    Facial swelling   Amoxicillin Other (See Comments)    Causes UTI    Ciprofloxacin Other (See Comments)    Unknown, found from records   Tramadol Nausea Only    Current Outpatient Medications on File Prior to Visit  Medication Sig Dispense Refill   fluticasone (FLONASE) 50 MCG/ACT nasal spray Place into both nostrils daily.     loratadine (CLARITIN) 10 MG tablet Take 10 mg by mouth daily.     ibuprofen (ADVIL) 800 MG tablet Take by mouth.     No current facility-administered medications on file prior to visit.    BP 132/84   Pulse 90    Temp 97.9 F (36.6 C) (Temporal)   Ht 5\' 3"  (1.6 m)   Wt 226 lb (102.5 kg)   SpO2 100%   BMI 40.03 kg/m  Objective:   Physical Exam Cardiovascular:     Rate and Rhythm: Normal rate and regular rhythm.  Pulmonary:     Effort: Pulmonary effort is normal.     Breath sounds: Normal breath sounds.  Abdominal:     Tenderness: There is no right CVA tenderness or left CVA tenderness.  Musculoskeletal:     Cervical back: Neck supple.  Skin:    General: Skin is warm and dry.  Psychiatric:        Mood and Affect: Mood normal.           Assessment & Plan:   Problem List Items Addressed This Visit       Other   Chronic back pain    Ongoing.  Reviewed plain films of lumbar spine 2021.  Negative for Rheumatoid arthritis.  UA today negative for obvious infection or potential renal stone. Exam today without CVA tenderness or signs of renal stone.  Start Cymbalta 20 mg daily. Discussed common side effects, expectations regarding medication.  She will update in about 4 weeks.      Relevant Medications   DULoxetine (CYMBALTA) 20 MG capsule   Chronic joint pain - Primary    Negative for rheumatoid arthritis from labs in July 2023.  Recommended exercise, walking daily. She kindly declines PT as she's been doing home stretches.   Reviewed xrays from 2021.  Recommended Cymbalta 20 mg daily for chronic pain.  Rx for Cymbalta 20 mg sent to pharmacy.        Relevant Medications   DULoxetine (CYMBALTA) 20 MG capsule   Other Visit Diagnoses     Dysuria       Relevant Orders   POCT Urinalysis Dipstick (Automated)          2022, NP

## 2022-07-26 NOTE — Assessment & Plan Note (Addendum)
Negative for rheumatoid arthritis from labs in July 2023.  Recommended exercise, walking daily. She kindly declines PT as she's been doing home stretches.   Reviewed xrays from 2021.  Recommended Cymbalta 20 mg daily for chronic pain.  Rx for Cymbalta 20 mg sent to pharmacy.

## 2022-07-26 NOTE — Patient Instructions (Addendum)
Start duloxetine (Cymbalta) 20 mg daily for joint/muscle pain.  Schedule a follow up visit for 4-5 weeks as discussed.  It was a pleasure to see you today!

## 2022-07-26 NOTE — Assessment & Plan Note (Addendum)
Ongoing.  Reviewed plain films of lumbar spine 2021.  Negative for Rheumatoid arthritis.  UA today negative for obvious infection or potential renal stone. Exam today without CVA tenderness or signs of renal stone.  Start Cymbalta 20 mg daily. Discussed common side effects, expectations regarding medication.  She will update in about 4 weeks.

## 2022-10-16 ENCOUNTER — Other Ambulatory Visit: Payer: Self-pay | Admitting: Primary Care

## 2022-10-16 DIAGNOSIS — G8929 Other chronic pain: Secondary | ICD-10-CM

## 2023-02-23 ENCOUNTER — Ambulatory Visit
Admission: RE | Admit: 2023-02-23 | Discharge: 2023-02-23 | Disposition: A | Discharge status: Home / Self Care | Payer: Managed Care, Other (non HMO) | Source: Ambulatory Visit | Attending: Primary Care | Admitting: Primary Care

## 2023-02-23 ENCOUNTER — Encounter: Payer: Self-pay | Admitting: Primary Care

## 2023-02-23 ENCOUNTER — Ambulatory Visit: Payer: Managed Care, Other (non HMO) | Admitting: Primary Care

## 2023-02-23 VITALS — BP 124/86 | HR 95 | Temp 98.6°F | Ht 63.0 in | Wt 224.0 lb

## 2023-02-23 DIAGNOSIS — R1032 Left lower quadrant pain: Secondary | ICD-10-CM | POA: Diagnosis not present

## 2023-02-23 DIAGNOSIS — R350 Frequency of micturition: Secondary | ICD-10-CM

## 2023-02-23 LAB — POC URINALSYSI DIPSTICK (AUTOMATED)
Bilirubin, UA: NEGATIVE
Blood, UA: NEGATIVE
Glucose, UA: NEGATIVE
Ketones, UA: NEGATIVE
Leukocytes, UA: NEGATIVE
Nitrite, UA: NEGATIVE
Protein, UA: NEGATIVE
Spec Grav, UA: 1.01 (ref 1.010–1.025)
Urobilinogen, UA: 0.2 E.U./dL
pH, UA: 6.5 (ref 5.0–8.0)

## 2023-02-23 LAB — CBC WITH DIFFERENTIAL/PLATELET
Basophils Absolute: 0 10*3/uL (ref 0.0–0.1)
Basophils Relative: 0.6 % (ref 0.0–3.0)
Eosinophils Absolute: 0.1 10*3/uL (ref 0.0–0.7)
Eosinophils Relative: 2.2 % (ref 0.0–5.0)
HCT: 39.4 % (ref 36.0–46.0)
Hemoglobin: 12.8 g/dL (ref 12.0–15.0)
Lymphocytes Relative: 35.2 % (ref 12.0–46.0)
Lymphs Abs: 1.4 10*3/uL (ref 0.7–4.0)
MCHC: 32.5 g/dL (ref 30.0–36.0)
MCV: 95.2 fl (ref 78.0–100.0)
Monocytes Absolute: 0.2 10*3/uL (ref 0.1–1.0)
Monocytes Relative: 6.5 % (ref 3.0–12.0)
Neutro Abs: 2.1 10*3/uL (ref 1.4–7.7)
Neutrophils Relative %: 55.5 % (ref 43.0–77.0)
Platelets: 320 10*3/uL (ref 150.0–400.0)
RBC: 4.14 Mil/uL (ref 3.87–5.11)
RDW: 13.9 % (ref 11.5–15.5)
WBC: 3.8 10*3/uL — ABNORMAL LOW (ref 4.0–10.5)

## 2023-02-23 LAB — BASIC METABOLIC PANEL
BUN: 8 mg/dL (ref 6–23)
CO2: 23 mEq/L (ref 19–32)
Calcium: 9.1 mg/dL (ref 8.4–10.5)
Chloride: 101 mEq/L (ref 96–112)
Creatinine, Ser: 0.62 mg/dL (ref 0.40–1.20)
GFR: 103.04 mL/min (ref >60.00)
Glucose, Bld: 107 mg/dL — ABNORMAL HIGH (ref 70–99)
Potassium: 4.6 mEq/L (ref 3.5–5.1)
Sodium: 136 mEq/L (ref 135–145)

## 2023-02-23 LAB — HEMOGLOBIN A1C: Hgb A1c MFr Bld: 5.4 % (ref 4.6–6.5)

## 2023-02-23 MED ORDER — IOPAMIDOL (ISOVUE-300) INJECTION 61%
100.0000 mL | Freq: Once | INTRAVENOUS | Status: AC | PRN
  Administered 2023-02-23: 100 mL via INTRAVENOUS

## 2023-02-23 NOTE — Patient Instructions (Signed)
Stop by the lab prior to leaving today. I will notify you of your results once received.   We will schedule your CT scan today.  It was a pleasure to see you today!

## 2023-02-23 NOTE — Progress Notes (Signed)
Subjective:    Patient ID: Jill Meyers, female    DOB: 09-09-1971, 52 y.o.   MRN: [id number]  HPI  Jill Meyers is a very pleasant 52 y.o. female with a history of chronic neck pain, cholecystectomy kidney stones, obesity, chronic joint pain, anxiety and depression who presents today abdominal and flank pain.  Symptom onset 2 to 3 weeks ago with left mid/lower abdominal pain with radiation to her left flank. Her symptoms originally began when leaning forward to pick up an object at work. Her pain is mostly constant, occurs with rest and movement.   About 5-6 weeks ago she experienced 1 episode of vomiting bile every morning for about 2 weeks. She read where she should not eat late at night so she stopped eating around 9 pm and her vomiting has resolved. Her last episode of morning vomiting was about 2-3 weeks ago.  She initially thought she had a kidney stone so she increased water intake without improvement. She's also noticed urinary frequency and urgency, especially at night so she reduced her intake of water intake at night. This did not make a difference. She's also noticed "swelling" to the left upper/mid abdomen, mild intermittent dysuria.   She's also been applying ice and heat with temporary improvement. She had a fasting glucose of 97 recently at work. Her last A1C was 5.5 two years ago.   She does have a long history of alternating constipation and diarrhea. Bowel movements occur 1-2 times daily, and there have been no changes in movements.   She denies fevers, recent vomiting, increased symptoms with eating/drinking, known history of diverticulosis or diverticulitis.    Review of Systems  Constitutional:  Negative for fever.  Gastrointestinal:  Positive for abdominal pain and nausea. Negative for blood in stool, constipation, diarrhea and vomiting.  Genitourinary:  Positive for dysuria, frequency and urgency.  Musculoskeletal:  Positive for back pain.         Past  Medical History:  Diagnosis Date   Allergic rhinitis    Chickenpox    Constipation    Kidney stones    Panic attacks    Prediabetes    UTI (urinary tract infection)     Social History   Socioeconomic History   Marital status: Married    Spouse name: Not on file   Number of children: Not on file   Years of education: Not on file   Highest education level: Not on file  Occupational History   Not on file  Tobacco Use   Smoking status: Never   Smokeless tobacco: Never  Substance and Sexual Activity   Alcohol use: Not Currently   Drug use: Not on file   Sexual activity: Not on file  Other Topics Concern   Not on file  Social History Narrative   Married.   Works as a Scientist, clinical (histocompatibility and immunogenetics).    Social Determinants of Health   Financial Resource Strain: Not on file  Food Insecurity: Not on file  Transportation Needs: Not on file  Physical Activity: Not on file  Stress: Not on file  Social Connections: Not on file  Intimate Partner Violence: Not on file    Past Surgical History:  Procedure Laterality Date   CHOLECYSTECTOMY  1996   LITHOTRIPSY     uterine polyp removal  2023    Family History  Problem Relation Age of Onset   Arthritis Mother    Hyperlipidemia Mother    Breast cancer Mother    Hyperlipidemia Father  Hypertension Father    Stroke Father    Throat cancer Father    Alcohol abuse Father    Arthritis Maternal Grandmother    Hearing loss Maternal Grandmother    Arthritis Maternal Grandfather    Heart disease Maternal Grandfather    Hypertension Maternal Grandfather     Allergies  Allergen Reactions   Prednisone Swelling    Facial swelling   Amoxicillin Other (See Comments)    Causes UTI    Ciprofloxacin Other (See Comments)    Unknown, found from records   Tramadol Nausea Only    Current Outpatient Medications on File Prior to Visit  Medication Sig Dispense Refill   phentermine 37.5 MG capsule Take by mouth.     DULoxetine (CYMBALTA) 20  MG capsule TAKE 1 CAPSULE (20 MG TOTAL) BY MOUTH DAILY. FOR PAIN (Patient not taking: Reported on 02/23/2023) 90 capsule 1   fluticasone (FLONASE) 50 MCG/ACT nasal spray Place into both nostrils daily. (Patient not taking: Reported on 02/23/2023)     ibuprofen (ADVIL) 800 MG tablet Take by mouth.     loratadine (CLARITIN) 10 MG tablet Take 10 mg by mouth daily. (Patient not taking: Reported on 02/23/2023)     No current facility-administered medications on file prior to visit.    BP 124/86   Pulse 95   Temp 98.6 F (37 C) (Temporal)   Ht 5\' 3"  (1.6 m)   Wt 224 lb (101.6 kg)   SpO2 96%   BMI 39.68 kg/m  Objective:   Physical Exam Constitutional:      General: She is not in acute distress.    Appearance: She is not ill-appearing.  Cardiovascular:     Rate and Rhythm: Normal rate.  Pulmonary:     Effort: Pulmonary effort is normal.  Abdominal:     Tenderness: There is abdominal tenderness in the suprapubic area and left lower quadrant.    Skin:    General: Skin is warm and dry.           Assessment & Plan:  Left lower quadrant abdominal pain Assessment & Plan: Concerning for diverticulitis. Other differentials include renal stone, acute cystitis, constipation.  Given duration of symptoms, coupled with presentation, will obtain stat CT abdomen pelvis for further evaluation.  UA today negative.  CBC with differential, BMP ordered and pending. She is stable for outpatient treatment at this time.  Await results.  Orders: -     CBC with Differential/Platelet -     Basic metabolic panel -     CT ABDOMEN PELVIS W CONTRAST; Future  Urinary frequency Assessment & Plan: CT abdomen pelvis ordered and pending.  UA today negative.  A1c pending.  Orders: -     POCT Urinalysis Dipstick (Automated) -     Hemoglobin A1c        Doreene Nest, NP

## 2023-02-23 NOTE — Assessment & Plan Note (Addendum)
CT abdomen pelvis ordered and pending.  UA today negative.  A1c pending.

## 2023-02-23 NOTE — Assessment & Plan Note (Addendum)
Concerning for diverticulitis. Other differentials include renal stone, acute cystitis, constipation.  Given duration of symptoms, coupled with presentation, will obtain stat CT abdomen pelvis for further evaluation.  UA today negative.  CBC with differential, BMP ordered and pending. She is stable for outpatient treatment at this time.  Await results.

## 2023-02-25 DIAGNOSIS — M545 Low back pain, unspecified: Secondary | ICD-10-CM

## 2023-02-25 MED ORDER — CYCLOBENZAPRINE HCL 5 MG PO TABS
5.0000 mg | ORAL_TABLET | Freq: Three times a day (TID) | ORAL | 0 refills | Status: DC | PRN
Start: 2023-02-25 — End: 2023-10-18

## 2023-03-08 NOTE — Telephone Encounter (Signed)
Called and scheduled patient appt for 03/10/23 per patient request.

## 2023-03-08 NOTE — Telephone Encounter (Signed)
Jill Meyers, please have patient scheduled tomorrow.

## 2023-03-10 ENCOUNTER — Encounter: Payer: Self-pay | Admitting: Primary Care

## 2023-03-10 ENCOUNTER — Ambulatory Visit: Payer: Managed Care, Other (non HMO) | Admitting: Primary Care

## 2023-03-10 VITALS — BP 132/84 | HR 105 | Temp 98.2°F | Ht 63.0 in | Wt 221.0 lb

## 2023-03-10 DIAGNOSIS — G8929 Other chronic pain: Secondary | ICD-10-CM | POA: Diagnosis not present

## 2023-03-10 DIAGNOSIS — M5442 Lumbago with sciatica, left side: Secondary | ICD-10-CM | POA: Diagnosis not present

## 2023-03-10 DIAGNOSIS — K649 Unspecified hemorrhoids: Secondary | ICD-10-CM

## 2023-03-10 DIAGNOSIS — M546 Pain in thoracic spine: Secondary | ICD-10-CM | POA: Diagnosis not present

## 2023-03-10 MED ORDER — HYDROCORTISONE ACETATE 25 MG RE SUPP
25.0000 mg | Freq: Two times a day (BID) | RECTAL | 0 refills | Status: DC
Start: 2023-03-10 — End: 2023-10-18

## 2023-03-10 MED ORDER — KETOROLAC TROMETHAMINE 60 MG/2ML IM SOLN
60.0000 mg | Freq: Once | INTRAMUSCULAR | Status: AC
Start: 2023-03-10 — End: 2023-03-10
  Administered 2023-03-10: 60 mg via INTRAMUSCULAR

## 2023-03-10 NOTE — Patient Instructions (Addendum)
You may use the suppositories twice daily for 6 days for hemorrhoids. Stop using them if you develop hives.   Start cyclobenzaprine muscle relaxer. You can take 1 tablet every 8 hours as needed for muscle spasms.   You will either be contacted via phone regarding your referral to orthopedics, or you may receive a letter on your MyChart portal from our referral team with instructions for scheduling an appointment. Please let us know if you have not been contacted by anyone within two weeks.  It was a pleasure to see you today!

## 2023-03-10 NOTE — Assessment & Plan Note (Signed)
OTC treatment ineffective.  Discussed options, including her allergy to prednisone. She has never tried rectal cortisone, discussed potential interaction.  She would like to proceed with trying Anusol suppositories as her prior reaction to oral prednisone was hives. She has never experienced throat tightness or respiratory symptoms.   Recommended she take an antihistamine prior to using the suppositories. She will discontinue if she develops any symptom of allergic reaction.

## 2023-03-10 NOTE — Assessment & Plan Note (Signed)
Ongoing.  Referral placed for orthopedic assessment.

## 2023-03-10 NOTE — Progress Notes (Signed)
Subjective:    Patient ID: Jill Meyers, female    DOB: [birth year], 52 y.o.   MRN: [id number]  Back Pain Associated symptoms include numbness. Pertinent negatives include no dysuria.    Jill Meyers is a very pleasant 52 y.o. female chronic joint pain, chronic back pain, renal stones who presents today to discuss left flank/back pain and hemorrhoids.  She was last evaluated on 02/23/2023 for a 2 to 3-week history of left mid lower abdominal pain with radiation to her left flank.  Symptoms originally began when leaning forward to pick up an object at work.    During her last visit she underwent CT abdomen/pelvis pain and history of renal stones and diverticulosis.  Her CT scan was without acute abnormality, sigmoid and descending colon diverticulosis without diverticulitis, and 1.4 cm left adrenal adenoma.  No renal stones within the ureter or kidneys.  She also underwent lab work which was grossly negative.  Urinalysis was negative.  She was provided treatment cyclobenzaprine 5 mg as needed.  On 03/08/2023 and progressive left flank/upper thoracic pain.  She was asked to come in for evaluation.   Today she discusses she experienced 1 week of consistent diarrhea after drinking the contrast from her CT scan. Since then she's noticed painful internal hemorrhoids. Her diarrhea has resolved. She's tried preparation H OTC cream without improvement.   She continues to experience left thoracic back/flank pain. She describes her pain as a tightness/pressure.  Her pain is mostly provoked with movement. She has no pain on palpation. She feels better with rest and laying down. She's been taking Tylenol, applying heat/ice with some improvement. She took cyclobenzaprine for a few days which did help with her symptoms but she's not been taking it regularly due to drowsiness.   She does have chronic lower back pain with left sided sciatica. She's undergone xrays of her lumbar and thoracic spine and was told she  had DDD. She has tried physical therapy in the past which didn't help with pain. She's been following with a chiropractor, has not seen orthopedics.   She denies rashes, injury/trauma, loss of bowel/bladder control.   Review of Systems  Gastrointestinal:  Negative for diarrhea.  Genitourinary:  Negative for dysuria and hematuria.  Musculoskeletal:  Positive for arthralgias, back pain and myalgias.  Skin:  Negative for rash.  Neurological:  Positive for numbness.         Past Medical History:  Diagnosis Date   Allergic rhinitis    Chickenpox    Constipation    Kidney stones    Panic attacks    Prediabetes    UTI (urinary tract infection)     Social History   Socioeconomic History   Marital status: Married    Spouse name: Not on file   Number of children: Not on file   Years of education: Not on file   Highest education level: Some college, no degree  Occupational History   Not on file  Tobacco Use   Smoking status: Never   Smokeless tobacco: Never  Substance and Sexual Activity   Alcohol use: Not Currently   Drug use: Not on file   Sexual activity: Not on file  Other Topics Concern   Not on file  Social History Narrative   Married.   Works as a Scientist, clinical (histocompatibility and immunogenetics).    Social Determinants of Health   Financial Resource Strain: Not on file  Food Insecurity: No Food Insecurity (03/09/2023)   Hunger Vital Sign  Worried About Programme researcher, broadcasting/film/video in the Last Year: Never true    Ran Out of Food in the Last Year: Never true  Transportation Needs: No Transportation Needs (03/09/2023)   PRAPARE - Administrator, Civil Service (Medical): No    Lack of Transportation (Non-Medical): No  Physical Activity: Unknown (03/09/2023)   Exercise Vital Sign    Days of Exercise per Week: 0 days    Minutes of Exercise per Session: Not on file  Stress: Stress Concern Present (03/09/2023)   [employer] of Occupational Health - Occupational Stress Questionnaire     Feeling of Stress : To some extent  Social Connections: Socially Integrated (03/09/2023)   Social Connection and Isolation Panel [NHANES]    Frequency of Communication with Friends and Family: More than three times a week    Frequency of Social Gatherings with Friends and Family: More than three times a week    Attends Religious Services: More than 4 times per year    Active Member of [employer] or Organizations: Yes    Attends Banker Meetings: 1 to 4 times per year    Marital Status: Married  Catering manager Violence: Not on file    Past Surgical History:  Procedure Laterality Date   CHOLECYSTECTOMY  1996   LITHOTRIPSY     uterine polyp removal  2023    Family History  Problem Relation Age of Onset   Arthritis Mother    Hyperlipidemia Mother    Breast cancer Mother    Hyperlipidemia Father    Hypertension Father    Stroke Father    Throat cancer Father    Alcohol abuse Father    Arthritis Maternal Grandmother    Hearing loss Maternal Grandmother    Arthritis Maternal Grandfather    Heart disease Maternal Grandfather    Hypertension Maternal Grandfather     Allergies  Allergen Reactions   Prednisone Swelling    Facial swelling   Amoxicillin Other (See Comments)    Causes UTI    Ciprofloxacin Other (See Comments)    Unknown, found from records   Tramadol Nausea Only    Current Outpatient Medications on File Prior to Visit  Medication Sig Dispense Refill   cyclobenzaprine (FLEXERIL) 5 MG tablet Take 1 tablet (5 mg total) by mouth 3 (three) times daily as needed for muscle spasms. 30 tablet 0   phentermine 37.5 MG capsule Take by mouth.     DULoxetine (CYMBALTA) 20 MG capsule TAKE 1 CAPSULE (20 MG TOTAL) BY MOUTH DAILY. FOR PAIN (Patient not taking: Reported on 02/23/2023) 90 capsule 1   fluticasone (FLONASE) 50 MCG/ACT nasal spray Place into both nostrils daily. (Patient not taking: Reported on 02/23/2023)     ibuprofen (ADVIL) 800 MG tablet Take by mouth.      loratadine (CLARITIN) 10 MG tablet Take 10 mg by mouth daily. (Patient not taking: Reported on 02/23/2023)     No current facility-administered medications on file prior to visit.    BP 132/84   Pulse (!) 105   Temp 98.2 F (36.8 C) (Temporal)   Ht 5\' 3"  (1.6 m)   Wt 221 lb (100.2 kg)   SpO2 98%   BMI 39.15 kg/m  Objective:   Physical Exam Pulmonary:     Effort: Pulmonary effort is normal.  Musculoskeletal:     Thoracic back: No tenderness or bony tenderness. Decreased range of motion.       Back:     Comments:  Pain with thoracic extension   Skin:    General: Skin is warm and dry.     Findings: No rash.  Neurological:     Mental Status: She is alert.          Assessment & Plan:  Chronic left-sided low back pain with left-sided sciatica Assessment & Plan: Ongoing.  Referral placed for orthopedic assessment.  Orders: -     Ambulatory referral to Orthopedic Surgery -     Ketorolac Tromethamine  Acute left-sided thoracic back pain Assessment & Plan: Exam and HPI consistent for MSK etiology, especially since CT abdomen/pelvis was negative for renal stones.  No rash to suggest shingles. Seems to be more MSK  Start cyclobenzaprine 5 mg TID as this has helped. We will write her out of work for the rest of this week so that she can take cyclobenzaprine.  Discussed use of ice/heat, stretching when able.  Referral placed for orthopedics.  IM Toradol 60 mg provided today.  Orders: -     Ambulatory referral to Orthopedic Surgery -     Ketorolac Tromethamine  Hemorrhoids, unspecified hemorrhoid type Assessment & Plan: OTC treatment ineffective.  Discussed options, including her allergy to prednisone. She has never tried rectal cortisone, discussed potential interaction.  She would like to proceed with trying Anusol suppositories as her prior reaction to oral prednisone was hives. She has never experienced throat tightness or respiratory symptoms.   Recommended  she take an antihistamine prior to using the suppositories. She will discontinue if she develops any symptom of allergic reaction.   Orders: -     Hydrocortisone Acetate; Place 1 suppository (25 mg total) rectally 2 (two) times daily.  Dispense: 12 suppository; Refill: 0        Doreene Nest, NP

## 2023-03-10 NOTE — Assessment & Plan Note (Addendum)
Exam and HPI consistent for MSK etiology, especially since CT abdomen/pelvis was negative for renal stones.  No rash to suggest shingles. Seems to be more MSK  Start cyclobenzaprine 5 mg TID as this has helped. We will write her out of work for the rest of this week so that she can take cyclobenzaprine.  Discussed use of ice/heat, stretching when able.  Referral placed for orthopedics.  IM Toradol 60 mg provided today.

## 2023-03-11 ENCOUNTER — Encounter: Payer: Self-pay | Admitting: *Deleted

## 2023-10-18 ENCOUNTER — Ambulatory Visit: Payer: Managed Care, Other (non HMO) | Admitting: Primary Care

## 2023-10-18 ENCOUNTER — Encounter: Payer: Self-pay | Admitting: Primary Care

## 2023-10-18 ENCOUNTER — Ambulatory Visit
Admission: RE | Admit: 2023-10-18 | Discharge: 2023-10-18 | Disposition: A | Discharge status: Home / Self Care | Payer: Managed Care, Other (non HMO) | Source: Ambulatory Visit | Attending: Primary Care

## 2023-10-18 VITALS — BP 132/82 | HR 100 | Temp 99.9°F | Ht 63.0 in | Wt 234.0 lb

## 2023-10-18 DIAGNOSIS — M5442 Lumbago with sciatica, left side: Secondary | ICD-10-CM

## 2023-10-18 DIAGNOSIS — M542 Cervicalgia: Secondary | ICD-10-CM

## 2023-10-18 DIAGNOSIS — G8929 Other chronic pain: Secondary | ICD-10-CM | POA: Insufficient documentation

## 2023-10-18 DIAGNOSIS — R1012 Left upper quadrant pain: Secondary | ICD-10-CM

## 2023-10-18 DIAGNOSIS — Z1211 Encounter for screening for malignant neoplasm of colon: Secondary | ICD-10-CM

## 2023-10-18 MED ORDER — CYCLOBENZAPRINE HCL 5 MG PO TABS
5.0000 mg | ORAL_TABLET | Freq: Every evening | ORAL | 0 refills | Status: DC | PRN
Start: 2023-10-18 — End: 2023-11-22

## 2023-10-18 NOTE — Assessment & Plan Note (Signed)
Ongoing, also with chronic left thigh numbness which is suspicious.  Unclear reason for lack of follow-through with referral to orthopedics. Referral placed today to neurosurgery.  Plain films of the lumbar spine ordered and pending.

## 2023-10-18 NOTE — Progress Notes (Signed)
Subjective:    Patient ID: Jill Meyers, female    DOB: 01/03/1971, 53 y.o.   MRN: [id number]  Neck Pain  Associated symptoms include numbness.    Jill Meyers is a very pleasant 53 y.o. female with a history of sigmoid and descending diverticulosis without diverticulitis, left upper quadrant abdominal pain, chronic back pain, chronic joint pain who presents today to discuss multiple concerns.  1) Abdominal Bloating/Abdominal Pain: Chronic history of abdominal bloating and LUQ abdominal pain intermittently since August 2022. Since then she's experienced intermittent episodes of generalized abdominal bloating, epigastric bloating and pain, left upper quadrant pain.   Her recent episode began four days ago with LUQ abdominal pain, epigastric pain, boating with diarrhea. She began a clear liquid diet three days ago, completed for about 24 hours. She then developed constipation and did not have a bowel movement until yesterday which was large. Yesterday she began feeling better, questions if she had constipation. Today she's experienced 4-5 loose bowel movements and continues to feel better. Typically, bowel movements are daily.  Appetite is at baseline.  She denies fevers, chills, body aches.  She has noticed bright red rectal bleeding with her large bowel movement yesterday.  She has been using Preparation H for her hemorrhoid with improvement.  She underwent CT abdomen/pelvis in June 2024 which was negative for acute process, but she was noted to have diverticuloses to the sigmoid and descending colon, small hiatal hernia, and left adrenal gland nodule.   2) Neck Pain: Chronic to the left lateral neck for the last 3 months. Her pain is intermittent for which she notices with movement of her neck to the left. She denies paresthesias and pain to the left upper extremity.   In 2017 she underwent CT cervical spine, MRI cervical spine for persistent left upper extremity paresthesias after a  chiropractic adjustment.  Results from these images showed mild degenerative disease, otherwise no cause for symptoms.  3) Chronic Back Pain: Chronic to the bilateral lower back for years. Her pain is constant with chronic numbness to the left thigh for years. Has been seeing chiropractors over the years, had multiple adjustments.  She was referred to orthopedics in June 2024.  Was told that the referral went to a physician that did not treat back pain, she never heard anything further.  She underwent x-ray of the lumbar spine in 2021 which revealed constipation, otherwise negative.  She does have chronic stress urinary incontinence with sneezing, laughing.  Today she was at work, left and had a small bowel movement.  BP Readings from Last 3 Encounters:  10/18/23 132/82  03/10/23 132/84  02/23/23 124/86   Wt Readings from Last 3 Encounters:  10/18/23 234 lb (106.1 kg)  03/10/23 221 lb (100.2 kg)  02/23/23 224 lb (101.6 kg)      Review of Systems  Gastrointestinal:  Positive for abdominal pain, constipation and diarrhea.  Musculoskeletal:  Positive for arthralgias, back pain and neck pain.  Neurological:  Positive for numbness.         Past Medical History:  Diagnosis Date   Allergic rhinitis    Chickenpox    Constipation    Kidney stones    Panic attacks    Prediabetes    UTI (urinary tract infection)     Social History   Socioeconomic History   Marital status: Married    Spouse name: Not on file   Number of children: Not on file   Years of education: Not on file  Highest education level: Some college, no degree  Occupational History   Not on file  Tobacco Use   Smoking status: Never   Smokeless tobacco: Never  Substance and Sexual Activity   Alcohol use: Not Currently   Drug use: Not on file   Sexual activity: Not on file  Other Topics Concern   Not on file  Social History Narrative   Married.   Works as a Scientist, clinical (histocompatibility and immunogenetics).    Social Drivers of  Health   Financial Resource Strain: Patient Declined (10/16/2023)   Overall Financial Resource Strain (CARDIA)    Difficulty of Paying Living Expenses: Patient declined  Food Insecurity: Patient Declined (10/16/2023)   Hunger Vital Sign    Worried About Running Out of Food in the Last Year: Patient declined    Ran Out of Food in the Last Year: Patient declined  Transportation Needs: Patient Declined (10/16/2023)   PRAPARE - Administrator, Civil Service (Medical): Patient declined    Lack of Transportation (Non-Medical): Patient declined  Physical Activity: Unknown (10/16/2023)   Exercise Vital Sign    Days of Exercise per Week: 4 days    Minutes of Exercise per Session: Patient declined  Stress: Stress Concern Present (10/16/2023)   [employer] of Occupational Health - Occupational Stress Questionnaire    Feeling of Stress : To some extent  Social Connections: Unknown (10/16/2023)   Social Connection and Isolation Panel [NHANES]    Frequency of Communication with Friends and Family: More than three times a week    Frequency of Social Gatherings with Friends and Family: Patient declined    Attends Religious Services: Patient declined    Database administrator or Organizations: Patient declined    Attends Banker Meetings: 1 to 4 times per year    Marital Status: Married  Catering manager Violence: Unknown (12/22/2021)   Received from [employer], [facility]   HITS    Physically Hurt: Not on file    Insult or Talk Down To: Not on file    Threaten Physical Harm: Not on file    Scream or Curse: Not on file    Past Surgical History:  Procedure Laterality Date   CHOLECYSTECTOMY  1996   LITHOTRIPSY     uterine polyp removal  2023    Family History  Problem Relation Age of Onset   Arthritis Mother    Hyperlipidemia Mother    Breast cancer Mother    Hyperlipidemia Father    Hypertension Father    Stroke Father    Throat cancer Father    Alcohol  abuse Father    Arthritis Maternal Grandmother    Hearing loss Maternal Grandmother    Arthritis Maternal Grandfather    Heart disease Maternal Grandfather    Hypertension Maternal Grandfather     Allergies  Allergen Reactions   Prednisone Swelling    Facial swelling   Amoxicillin Other (See Comments)    Causes UTI    Ciprofloxacin Other (See Comments)    Unknown, found from records   Tramadol Nausea Only    Current Outpatient Medications on File Prior to Visit  Medication Sig Dispense Refill   loratadine (CLARITIN) 10 MG tablet Take 10 mg by mouth daily. (Patient not taking: Reported on 10/18/2023)     No current facility-administered medications on file prior to visit.    BP 132/82   Pulse 100   Temp 99.9 F (37.7 C) (Temporal)   Ht 5\' 3"  (1.6  m)   Wt 234 lb (106.1 kg)   LMP 09/30/2023 (Exact Date)   SpO2 99%   BMI 41.45 kg/m  Objective:   Physical Exam Cardiovascular:     Rate and Rhythm: Normal rate and regular rhythm.  Pulmonary:     Effort: Pulmonary effort is normal.     Breath sounds: Normal breath sounds.  Abdominal:     General: Bowel sounds are normal.     Palpations: Abdomen is soft.     Tenderness: There is abdominal tenderness in the periumbilical area.    Musculoskeletal:     Cervical back: Neck supple. No tenderness or bony tenderness. Decreased range of motion.     Lumbar back: No tenderness or bony tenderness. Decreased range of motion. Negative right straight leg raise test and negative left straight leg raise test.       Back:     Comments: Decreased range of motion to cervical spine with left lateral rotation.  Skin:    General: Skin is warm and dry.  Neurological:     Mental Status: She is alert and oriented to person, place, and time.  Psychiatric:        Mood and Affect: Mood normal.           Assessment & Plan:  Chronic neck pain Assessment & Plan: Differentials include osteoarthritis, muscle strain.  Start  cyclobenzaprine 5 mg at bedtime as needed. Plain films of the cervical spine ordered and pending.  Reviewed CT and MRI of cervical spine from 2017.  Orders: -     DG Cervical Spine 2 or 3 views -     Cyclobenzaprine HCl; Take 1 tablet (5 mg total) by mouth at bedtime as needed for muscle spasms.  Dispense: 15 tablet; Refill: 0  Screening for colon cancer -     Ambulatory referral to Gastroenterology  Chronic left-sided low back pain with left-sided sciatica Assessment & Plan: Ongoing, also with chronic left thigh numbness which is suspicious.  Unclear reason for lack of follow-through with referral to orthopedics. Referral placed today to neurosurgery.  Plain films of the lumbar spine ordered and pending.  Orders: -     Ambulatory referral to Neurosurgery -     DG Lumbar Spine 2-3 Views  Left upper quadrant abdominal pain Assessment & Plan: Improved.  Differentials include constipation, IBS, H. pylori infection.  Since she has improved we will continue to monitor. Referral placed to GI for screening colonoscopy.  H. pylori breath test ordered and pending. CBC with differential ordered and pending.  Orders: -     CBC with Differential/Platelet -     H. pylori breath test        Doreene Nest, NP

## 2023-10-18 NOTE — Assessment & Plan Note (Signed)
Differentials include osteoarthritis, muscle strain.  Start cyclobenzaprine 5 mg at bedtime as needed. Plain films of the cervical spine ordered and pending.  Reviewed CT and MRI of cervical spine from 2017.

## 2023-10-18 NOTE — Assessment & Plan Note (Addendum)
Improved.  Differentials include constipation, IBS, H. pylori infection.  Since she has improved we will continue to monitor. Referral placed to GI for screening colonoscopy.  H. pylori breath test ordered and pending. CBC with differential ordered and pending.

## 2023-10-18 NOTE — Patient Instructions (Addendum)
Complete xray(s) and labs prior to leaving today. I will notify you of your results once received.  You will receive a phone call regarding the colonoscopy.  You will either be contacted via phone regarding your referral to neurosurgery, or you may receive a letter on your MyChart portal from our referral team with instructions for scheduling an appointment. Please let us know if you have not been contacted by anyone within two weeks.  You may take cyclobenzaprine 5 mg at bedtime as needed for your neck pain.  It was a pleasure to see you today!

## 2023-10-19 LAB — CBC WITH DIFFERENTIAL/PLATELET
Basophils Absolute: 0 10*3/uL (ref 0.0–0.1)
Basophils Relative: 0.7 % (ref 0.0–3.0)
Eosinophils Absolute: 0.1 10*3/uL (ref 0.0–0.7)
Eosinophils Relative: 1.4 % (ref 0.0–5.0)
HCT: 38.9 % (ref 36.0–46.0)
Hemoglobin: 12.9 g/dL (ref 12.0–15.0)
Lymphocytes Relative: 27.3 % (ref 12.0–46.0)
Lymphs Abs: 1.7 10*3/uL (ref 0.7–4.0)
MCHC: 33.1 g/dL (ref 30.0–36.0)
MCV: 95 fL (ref 78.0–100.0)
Monocytes Absolute: 0.4 10*3/uL (ref 0.1–1.0)
Monocytes Relative: 6.2 % (ref 3.0–12.0)
Neutro Abs: 4 10*3/uL (ref 1.4–7.7)
Neutrophils Relative %: 64.4 % (ref 43.0–77.0)
Platelets: 336 10*3/uL (ref 150.0–400.0)
RBC: 4.09 Mil/uL (ref 3.87–5.11)
RDW: 13.8 % (ref 11.5–15.5)
WBC: 6.3 10*3/uL (ref 4.0–10.5)

## 2023-10-20 LAB — H. PYLORI BREATH TEST: H. pylori Breath Test: NOT DETECTED

## 2023-11-22 ENCOUNTER — Telehealth: Admitting: Physician Assistant

## 2023-11-22 DIAGNOSIS — R3989 Other symptoms and signs involving the genitourinary system: Secondary | ICD-10-CM

## 2023-11-22 MED ORDER — SULFAMETHOXAZOLE-TRIMETHOPRIM 800-160 MG PO TABS: 1.0000 | ORAL_TABLET | Freq: Two times a day (BID) | ORAL | 0 refills | Status: DC

## 2023-11-22 NOTE — Patient Instructions (Signed)
 Jill Meyers, thank you for joining [name], PA-C for today's virtual visit.  While this provider is not your primary care provider (PCP), if your PCP is located in our provider database this encounter information will be shared with them immediately following your visit.   A [facility] MyChart account gives you access to today's visit and all your visits, tests, and labs performed at [facility] " click here if you don't have a [facility] MyChart account or go to [email or web address]  Consent: (Patient) Jill Meyers provided verbal consent for this virtual visit at the beginning of the encounter.  Current Medications:  Current Outpatient Medications:    cyclobenzaprine (FLEXERIL) 5 MG tablet, Take 1 tablet (5 mg total) by mouth at bedtime as needed for muscle spasms., Disp: 15 tablet, Rfl: 0   loratadine (CLARITIN) 10 MG tablet, Take 10 mg by mouth daily. (Patient not taking: Reported on 10/18/2023), Disp: , Rfl:    Medications ordered in this encounter:  No orders of the defined types were placed in this encounter.    *If you need refills on other medications prior to your next appointment, please contact your pharmacy*  Follow-Up: Call back or seek an in-person evaluation if the symptoms worsen or if the condition fails to improve as anticipated.  [facility] [phone]  Other Instructions Your symptoms are consistent with a bladder infection, also called acute cystitis. Please take your antibiotic (Bactrim) as directed until all pills are gone.  Stay very well hydrated.  Consider a daily probiotic (Align, Culturelle, or Activia) to help prevent stomach upset caused by the antibiotic.  Taking a probiotic daily may also help prevent recurrent UTIs.  Also consider taking AZO (Phenazopyridine) tablets to help decrease pain with urination.   Urinary Tract Infection A urinary tract infection (UTI) can occur any place along the urinary tract.  The tract includes the kidneys, ureters, bladder, and urethra. A type of germ called bacteria often causes a UTI. UTIs are often helped with antibiotic medicine.  HOME CARE  If given, take antibiotics as told by your doctor. Finish them even if you start to feel better. Drink enough fluids to keep your pee (urine) clear or pale yellow. Avoid tea, drinks with caffeine, and bubbly (carbonated) drinks. Pee often. Avoid holding your pee in for a long time. Pee before and after having sex (intercourse). Wipe from front to back after you poop (bowel movement) if you are a woman. Use each tissue only once. GET HELP RIGHT AWAY IF:  You have back pain. You have lower belly (abdominal) pain. You have chills. You feel sick to your stomach (nauseous). You throw up (vomit). Your burning or discomfort with peeing does not go away. You have a fever. Your symptoms are not better in 3 days. MAKE SURE YOU:  Understand these instructions. Will watch your condition. Will get help right away if you are not doing well or get worse. Document Released: 02/23/2008 Document Revised: 05/31/2012 Document Reviewed: 04/06/2012 [facility] Patient Information 2015 [place], [place]. This information is not intended to replace advice given to you by your health care provider. Make sure you discuss any questions you have with your health care provider.    If you have been instructed to have an in-person evaluation today at a local Urgent Care facility, please use the link below. It will take you to a list of all of our available [facility], including address, phone number and hours of operation. Please  do not delay care.  [facility]  If you or a family member do not have a primary care provider, use the link below to schedule a visit and establish care. When you choose a [facility] primary care physician or advanced practice provider, you gain a long-term partner in health. Find a Primary Care  Provider  Learn more about [facility]'s in-office and virtual care options: [facility] - Get Care Now

## 2023-11-22 NOTE — Progress Notes (Signed)
 Virtual Visit Consent   Jill Meyers, you are scheduled for a virtual visit with a [facility] provider today. Just as with appointments in the office, your consent must be obtained to participate. Your consent will be active for this visit and any virtual visit you may have with one of our providers in the next 365 days. If you have a MyChart account, a copy of this consent can be sent to you electronically.  As this is a virtual visit, video technology does not allow for your provider to perform a traditional examination. This may limit your provider's ability to fully assess your condition. If your provider identifies any concerns that need to be evaluated in person or the need to arrange testing (such as labs, EKG, etc.), we will make arrangements to do so. Although advances in technology are sophisticated, we cannot ensure that it will always work on either your end or our end. If the connection with a video visit is poor, the visit may have to be switched to a telephone visit. With either a video or telephone visit, we are not always able to ensure that we have a secure connection.  By engaging in this virtual visit, you consent to the provision of healthcare and authorize for your insurance to be billed (if applicable) for the services provided during this visit. Depending on your insurance coverage, you may receive a charge related to this service.  I need to obtain your verbal consent now. Are you willing to proceed with your visit today? Jill Meyers has provided verbal consent on 11/22/2023 for a virtual visit (video or telephone). Jill Meyers, [place]  Date: 11/22/2023 10:05 AM   Virtual Visit via Video Note   I, Jill Meyers, connected with  Fynlee Rowlands  ([id number], 1971-05-25) on 11/22/23 at 10:45 AM EST by a video-enabled telemedicine application and verified that I am speaking with the correct person using two identifiers.  Location: Patient: Virtual Visit Location  Patient: Home Provider: Virtual Visit Location Provider: Home Office   I discussed the limitations of evaluation and management by telemedicine and the availability of in person appointments. The patient expressed understanding and agreed to proceed.    History of Present Illness: Jill Meyers is a 53 y.o. who identifies as a female who was assigned female at birth, and is being seen today for possible UTI. Patient endorses overnight into today with urinary urgency, frequency with dysuria and chills. Denies fever. Denies back pain. Notes suprapubic pressure. Has remote history of UTI and this feels similar. LMP -- 2 weeks ago.   HPI: HPI  Problems:  Patient Active Problem List   Diagnosis Date Noted   Chronic neck pain 10/18/2023   Hemorrhoids 03/10/2023   Left lower quadrant abdominal pain 02/23/2023   Urinary frequency 02/23/2023   Tinnitus 07/08/2022   Snoring 05/12/2022   Adjustment reaction with anxiety and depression 03/31/2022   Encounter for annual general medical examination with abnormal findings in adult 03/31/2022   Chronic joint pain 03/26/2022   Dizziness 03/26/2022   Localized skin mass, lump, or swelling 08/05/2021   Left upper quadrant abdominal pain 05/13/2021   Acute left-sided thoracic back pain 05/13/2021   Chronic pain of right heel 10/20/2020   Flank pain 10/16/2019   Chronic back pain 08/11/2018   Obesity (BMI 35.0-39.9 without comorbidity) 08/11/2018    Allergies:  Allergies  Allergen Reactions   Prednisone Swelling    Facial swelling   Amoxicillin Other (See Comments)  Causes UTI    Ciprofloxacin Other (See Comments)    Unknown, found from records   Tramadol Nausea Only   Medications:  Current Outpatient Medications:    loratadine (CLARITIN) 10 MG tablet, Take 10 mg by mouth daily. (Patient not taking: Reported on 10/18/2023), Disp: , Rfl:   Observations/Objective: Patient is well-developed, well-nourished in no acute distress.  Resting  comfortably at home.  Head is normocephalic, atraumatic.  No labored breathing. Speech is clear and coherent with logical content.  Patient is alert and oriented at baseline.   Assessment and Plan: 1. Suspected UTI (Primary)  Classic UTI symptoms with absence of alarm signs or symptoms. Prior history of UTI. Will treat empirically with Bactrim for suspected uncomplicated cystitis. Supportive measures and OTC medications reviewed. Strict in-person evaluation precautions discussed.    Follow Up Instructions: I discussed the assessment and treatment plan with the patient. The patient was provided an opportunity to ask questions and all were answered. The patient agreed with the plan and demonstrated an understanding of the instructions.  A copy of instructions were sent to the patient via MyChart unless otherwise noted below.   The patient was advised to call back or seek an in-person evaluation if the symptoms worsen or if the condition fails to improve as anticipated.    Jill Climes, PA-C

## 2023-11-25 ENCOUNTER — Ambulatory Visit: Payer: Self-pay | Admitting: Primary Care

## 2023-11-25 ENCOUNTER — Encounter

## 2023-11-25 ENCOUNTER — Telehealth: Admitting: Family Medicine

## 2023-11-25 DIAGNOSIS — B3731 Acute candidiasis of vulva and vagina: Secondary | ICD-10-CM

## 2023-11-25 MED ORDER — FLUCONAZOLE 150 MG PO TABS: 150.0000 mg | ORAL_TABLET | Freq: Every day | ORAL | 0 refills | Status: AC

## 2023-11-25 NOTE — Telephone Encounter (Signed)
 Called patient she states she has been contacted by virtual provider. They have called her in a script. If she continues to have symptoms she agreed to call office to set up visit.

## 2023-11-25 NOTE — Telephone Encounter (Signed)
 Chief Complaint: Yeast infection symptoms Symptoms: Itching, vaginal discharge (clear/white) Frequency: Ongoing Disposition: [] ED /[] Urgent Care (no appt availability in office) / [] Appointment(In office/virtual)/ []  [place] [facility]/ [] Home Care/ [] Refused Recommended Disposition /[] [facility] [facility]/ [x]  Follow-up with PCP Additional Notes: Pt states on Tuesday night she was having symptoms of a UTI. Pt had a virtual office visit appointment on 3/4 with Marcelline Mates, PA. Pt states she has been taking Bactrim bid but now has symptoms of a yeast infection. Pt states she thinks she needs diflucan.  Pt states her UTI is getting better but she is now having symptoms of a yeast infection. Pt would like a call back today to let her know whether she can have a prescription called in for yeast infection or if she has to make another appointment.     Copied from CRM [id number]. Topic: Clinical - Red Word Triage >> Nov 25, 2023 11:55 AM Elizebeth Brooking wrote: [facility] that prompted transfer to Nurse Triage: Is on Bactrim , Was diagnosed a UTI , stated she believes she has a yeast infection due to the antibitoics . Physicans stated if she was to start seeing symptoms of a yeast infection to call the nurse Reason for Disposition  MODERATE-SEVERE itching (i.e., interferes with school, work, or sleep)  Answer Assessment - Initial Assessment Questions 1. SYMPTOM: "What's the main symptom you're concerned about?" (e.g., pain, itching, dryness)     Itching, vaginal discharge (clear/white) 2. ONSET: "When did the  itching  start?"     This morning  Protocols used: Vaginal Symptoms-A-AH

## 2023-11-25 NOTE — Progress Notes (Signed)

## 2023-12-08 ENCOUNTER — Telehealth: Admitting: Primary Care

## 2024-03-13 ENCOUNTER — Ambulatory Visit: Admitting: Primary Care

## 2024-03-13 ENCOUNTER — Ambulatory Visit: Payer: Self-pay | Admitting: Primary Care

## 2024-03-13 ENCOUNTER — Encounter: Payer: Self-pay | Admitting: Primary Care

## 2024-03-13 VITALS — BP 136/84 | HR 85 | Temp 98.1°F | Ht 63.0 in | Wt 243.0 lb

## 2024-03-13 DIAGNOSIS — R519 Headache, unspecified: Secondary | ICD-10-CM | POA: Diagnosis not present

## 2024-03-13 DIAGNOSIS — R0981 Nasal congestion: Secondary | ICD-10-CM | POA: Insufficient documentation

## 2024-03-13 LAB — POC COVID19 BINAXNOW: SARS Coronavirus 2 Ag: NEGATIVE

## 2024-03-13 MED ORDER — KETOROLAC TROMETHAMINE 60 MG/2ML IM SOLN
60.0000 mg | Freq: Once | INTRAMUSCULAR | Status: AC
  Administered 2024-03-13: 60 mg via INTRAMUSCULAR

## 2024-03-13 NOTE — Patient Instructions (Addendum)
 Continue Claritin and Tylenol .  Nasal Congestion/Ear Pressure/Sinus Pressure: Try using Flonase (fluticasone) nasal spray. Instill 1 spray in each nostril twice daily.   Please update me Friday if your symptoms are no better or are worse.  It was a pleasure to see you today!

## 2024-03-13 NOTE — Assessment & Plan Note (Signed)
 With other URI symptoms.  Symptoms do seem to represent viral etiology at this point. Rapid COVID-19 test negative.  Toradol  60 mg provided intramuscularly today for headaches. Unfortunately, she is allergic to prednisone which limits treatment options.  Continue antihistamine and Tylenol . Add Flonase, she has tolerated well previously.  Consider antibiotic treatment in 3-4 days if symptoms are not improving or are progressing.

## 2024-03-13 NOTE — Progress Notes (Signed)
 Subjective:    Patient ID: Jill Meyers, female    DOB: 29-Mar-1971, 53 y.o.   MRN: [id number]  Sinus Problem Associated symptoms include congestion, coughing, ear pain, headaches, sinus pressure and a sore throat. Pertinent negatives include no shortness of breath.    Jill Meyers is a very pleasant 53 y.o. female with a history of skin mass, tinnitus, chronic neck pain who presents today to discuss URI symptoms.   Symptom onset 4 days ago with left sided lymph node swelling, sore throat, and left neck pain with radiation of pain to her left ear. She then noticed nasal congestion, nasal bridge pain, frontal and occipital lobe headache, post nasal drip. She is coughing from the post nasal drip.   She's been taking Tylenol  and Ibuprofen, and Claritin without improvement. She is not taking nitroglycerin patches due to headaches.    Review of Systems  Constitutional:  Positive for fatigue. Negative for fever.  HENT:  Positive for congestion, ear pain, postnasal drip, sinus pressure and sore throat.   Respiratory:  Positive for cough. Negative for shortness of breath.   Neurological:  Positive for headaches.         Past Medical History:  Diagnosis Date   Allergic rhinitis    Chickenpox    Constipation    Kidney stones    Panic attacks    Prediabetes    UTI (urinary tract infection)     Social History   Socioeconomic History   Marital status: Married    Spouse name: Not on file   Number of children: Not on file   Years of education: Not on file   Highest education level: Associate degree: occupational, Scientist, product/process development, or vocational program  Occupational History   Not on file  Tobacco Use   Smoking status: Never   Smokeless tobacco: Never  Substance and Sexual Activity   Alcohol use: Not Currently   Drug use: Not on file   Sexual activity: Not on file  Other Topics Concern   Not on file  Social History Narrative   Married.   Works as a Scientist, clinical (histocompatibility and immunogenetics).    Social  Drivers of Corporate investment banker Strain: Low Risk  (03/13/2024)   Overall Financial Resource Strain (CARDIA)    Difficulty of Paying Living Expenses: Not very hard  Food Insecurity: No Food Insecurity (03/13/2024)   Hunger Vital Sign    Worried About Running Out of Food in the Last Year: Never true    Ran Out of Food in the Last Year: Never true  Transportation Needs: No Transportation Needs (03/13/2024)   PRAPARE - Administrator, Civil Service (Medical): No    Lack of Transportation (Non-Medical): No  Physical Activity: Inactive (03/13/2024)   Exercise Vital Sign    Days of Exercise per Week: 0 days    Minutes of Exercise per Session: Not on file  Stress: Stress Concern Present (03/13/2024)   [employer] of Occupational Health - Occupational Stress Questionnaire    Feeling of Stress: Rather much  Social Connections: Moderately Integrated (03/13/2024)   Social Connection and Isolation Panel    Frequency of Communication with Friends and Family: More than three times a week    Frequency of Social Gatherings with Friends and Family: Once a week    Attends Religious Services: More than 4 times per year    Active Member of [employer] or Organizations: No    Attends Banker Meetings: Not on file  Marital Status: Married  Catering manager Violence: Unknown (12/22/2021)   Received from [facility]   HITS    Physically Hurt: Not on file    Insult or Talk Down To: Not on file    Threaten Physical Harm: Not on file    Scream or Curse: Not on file    Past Surgical History:  Procedure Laterality Date   CHOLECYSTECTOMY  1996   LITHOTRIPSY     uterine polyp removal  2023    Family History  Problem Relation Age of Onset   Arthritis Mother    Hyperlipidemia Mother    Breast cancer Mother    Hyperlipidemia Father    Hypertension Father    Stroke Father    Throat cancer Father    Alcohol abuse Father    Arthritis Maternal Grandmother    Hearing loss  Maternal Grandmother    Arthritis Maternal Grandfather    Heart disease Maternal Grandfather    Hypertension Maternal Grandfather     Allergies  Allergen Reactions   Prednisone Swelling    Facial swelling   Amoxicillin  Other (See Comments)    Causes UTI    Ciprofloxacin Other (See Comments)    Unknown, found from records   Tramadol Nausea Only    Current Outpatient Medications on File Prior to Visit  Medication Sig Dispense Refill   loratadine (CLARITIN) 10 MG tablet Take 10 mg by mouth daily.     No current facility-administered medications on file prior to visit.    BP 136/84   Pulse 85   Temp 98.1 F (36.7 C) (Temporal)   Ht 5' 3 (1.6 m)   Wt 243 lb (110.2 kg)   LMP 11/28/2023 (Approximate)   SpO2 98%   BMI 43.05 kg/m  Objective:   Physical Exam Constitutional:      Appearance: She is not ill-appearing.  HENT:     Right Ear: Tympanic membrane and ear canal normal.     Left Ear: Tympanic membrane and ear canal normal.     Nose: No mucosal edema.     Right Sinus: No maxillary sinus tenderness or frontal sinus tenderness.     Left Sinus: No maxillary sinus tenderness or frontal sinus tenderness.     Mouth/Throat:     Mouth: Mucous membranes are moist.   Eyes:     Conjunctiva/sclera: Conjunctivae normal.    Cardiovascular:     Rate and Rhythm: Normal rate and regular rhythm.  Pulmonary:     Effort: Pulmonary effort is normal.     Breath sounds: Normal breath sounds. No wheezing or rhonchi.   Musculoskeletal:     Cervical back: Neck supple.   Skin:    General: Skin is warm and dry.           Assessment & Plan:  Nasal congestion Assessment & Plan: With other URI symptoms.  Symptoms do seem to represent viral etiology at this point. Rapid COVID-19 test negative.  Toradol  60 mg provided intramuscularly today for headaches. Unfortunately, she is allergic to prednisone which limits treatment options.  Continue antihistamine and Tylenol . Add  Flonase, she has tolerated well previously.  Consider antibiotic treatment in 3-4 days if symptoms are not improving or are progressing.  Orders: -     POC COVID-19 BinaxNow        Comer MARLA Gaskins, NP

## 2024-03-13 NOTE — Addendum Note (Signed)
 Addended by: NARCISO ANDREZ BROCKS on: 03/13/2024 03:59 PM   Modules accepted: Orders

## 2024-03-16 ENCOUNTER — Ambulatory Visit: Admitting: Primary Care

## 2024-03-16 ENCOUNTER — Encounter: Payer: Self-pay | Admitting: Primary Care

## 2024-03-16 ENCOUNTER — Ambulatory Visit: Payer: Self-pay | Admitting: Primary Care

## 2024-03-16 ENCOUNTER — Ambulatory Visit (INDEPENDENT_AMBULATORY_CARE_PROVIDER_SITE_OTHER)
Admission: RE | Admit: 2024-03-16 | Discharge: 2024-03-16 | Disposition: A | Discharge status: Home / Self Care | Source: Ambulatory Visit | Attending: Primary Care | Admitting: Primary Care

## 2024-03-16 VITALS — BP 126/82 | HR 101 | Temp 98.0°F | Ht 63.0 in | Wt 238.0 lb

## 2024-03-16 DIAGNOSIS — R051 Acute cough: Secondary | ICD-10-CM | POA: Diagnosis not present

## 2024-03-16 MED ORDER — DOXYCYCLINE HYCLATE 100 MG PO TABS: 100.0000 mg | ORAL_TABLET | Freq: Two times a day (BID) | ORAL | 0 refills | Status: DC

## 2024-03-16 MED ORDER — BENZONATATE 200 MG PO CAPS: 200.0000 mg | ORAL_CAPSULE | Freq: Three times a day (TID) | ORAL | 0 refills | Status: DC | PRN

## 2024-03-16 NOTE — Patient Instructions (Signed)
 Complete xray(s) prior to leaving today. I will notify you of your results once received.  Start Doxycycline antibiotic for the infection. Take 1 tablet by mouth twice daily for 7 days.  Start Benzonatate capsules for cough. Take 1 capsule by mouth three times daily as needed for cough.  It was a pleasure to see you today!

## 2024-03-16 NOTE — Progress Notes (Signed)
 Subjective:    Patient ID: Jill Meyers, female    DOB: 11/16/1970, 53 y.o.   MRN: [id number]  Sinus Problem Associated symptoms include congestion, coughing, shortness of breath and a sore throat. Pertinent negatives include no headaches.    Jill Meyers is a very pleasant 53 y.o. female with a history of dizziness, tinnitus, chronic neck pain, nasal congestion who presents today to discuss cough.  She was last evaluated 3 days ago for a 4-day history of sore throat, neck pain, nasal congestion, postnasal drip, frontal and occipital headaches.  Examination and HPI consistent for viral URI so she was treated with Toradol  60 mg IM for her headaches and recommended to take Flonase.  Her rapid COVID-19 test was negative.  Since her last visit she's feeling worse. She's developed fevers ranging 100-102, increased cough, increased fatigue, shortness of breath.  Her cough is productive with brown foamy sputum.  She took a home Covid-19 test two days ago which was negative. She's been taking Tylenol  and Ibuprofen for her fevers which have not helped.   Review of Systems  Constitutional:  Positive for fatigue and fever.  HENT:  Positive for congestion and sore throat.   Respiratory:  Positive for cough and shortness of breath.   Neurological:  Negative for headaches.         Past Medical History:  Diagnosis Date   Allergic rhinitis    Chickenpox    Constipation    Kidney stones    Panic attacks    Prediabetes    UTI (urinary tract infection)     Social History   Socioeconomic History   Marital status: Married    Spouse name: Not on file   Number of children: Not on file   Years of education: Not on file   Highest education level: Associate degree: occupational, Scientist, product/process development, or vocational program  Occupational History   Not on file  Tobacco Use   Smoking status: Never   Smokeless tobacco: Never  Substance and Sexual Activity   Alcohol use: Not Currently   Drug use: Not on  file   Sexual activity: Not on file  Other Topics Concern   Not on file  Social History Narrative   Married.   Works as a Scientist, clinical (histocompatibility and immunogenetics).    Social Drivers of Corporate investment banker Strain: Low Risk  (03/13/2024)   Overall Financial Resource Strain (CARDIA)    Difficulty of Paying Living Expenses: Not very hard  Food Insecurity: No Food Insecurity (03/13/2024)   Hunger Vital Sign    Worried About Running Out of Food in the Last Year: Never true    Ran Out of Food in the Last Year: Never true  Transportation Needs: No Transportation Needs (03/13/2024)   PRAPARE - Administrator, Civil Service (Medical): No    Lack of Transportation (Non-Medical): No  Physical Activity: Inactive (03/13/2024)   Exercise Vital Sign    Days of Exercise per Week: 0 days    Minutes of Exercise per Session: Not on file  Stress: Stress Concern Present (03/13/2024)   [employer] of Occupational Health - Occupational Stress Questionnaire    Feeling of Stress: Rather much  Social Connections: Moderately Integrated (03/13/2024)   Social Connection and Isolation Panel    Frequency of Communication with Friends and Family: More than three times a week    Frequency of Social Gatherings with Friends and Family: Once a week    Attends Religious Services: More than 4  times per year    Active Member of Clubs or Organizations: No    Attends Banker Meetings: Not on file    Marital Status: Married  Intimate Partner Violence: Unknown (12/22/2021)   Received from [facility]   HITS    Physically Hurt: Not on file    Insult or Talk Down To: Not on file    Threaten Physical Harm: Not on file    Scream or Curse: Not on file    Past Surgical History:  Procedure Laterality Date   CHOLECYSTECTOMY  1996   LITHOTRIPSY     uterine polyp removal  2023    Family History  Problem Relation Age of Onset   Arthritis Mother    Hyperlipidemia Mother    Breast cancer Mother     Hyperlipidemia Father    Hypertension Father    Stroke Father    Throat cancer Father    Alcohol abuse Father    Arthritis Maternal Grandmother    Hearing loss Maternal Grandmother    Arthritis Maternal Grandfather    Heart disease Maternal Grandfather    Hypertension Maternal Grandfather     Allergies  Allergen Reactions   Prednisone Swelling    Facial swelling   Amoxicillin  Other (See Comments)    Causes UTI    Ciprofloxacin Other (See Comments)    Unknown, found from records   Tramadol Nausea Only    Current Outpatient Medications on File Prior to Visit  Medication Sig Dispense Refill   loratadine (CLARITIN) 10 MG tablet Take 10 mg by mouth daily.     No current facility-administered medications on file prior to visit.    BP 126/82   Pulse (!) 101   Temp 98 F (36.7 C) (Oral)   Ht 5' 3 (1.6 m)   Wt 238 lb (108 kg)   LMP 12/30/2023 (Approximate)   SpO2 98%   BMI 42.16 kg/m  Objective:   Physical Exam Constitutional:      Appearance: She is ill-appearing.  HENT:     Right Ear: Tympanic membrane and ear canal normal.     Left Ear: Tympanic membrane and ear canal normal.     Nose: No mucosal edema.     Right Sinus: No maxillary sinus tenderness or frontal sinus tenderness.     Left Sinus: No maxillary sinus tenderness or frontal sinus tenderness.     Mouth/Throat:     Mouth: Mucous membranes are moist.   Eyes:     Conjunctiva/sclera: Conjunctivae normal.    Cardiovascular:     Rate and Rhythm: Regular rhythm. Tachycardia present.  Pulmonary:     Effort: Pulmonary effort is normal.     Breath sounds: Normal breath sounds. No wheezing or rhonchi.   Musculoskeletal:     Cervical back: Neck supple.   Skin:    General: Skin is warm and dry.           Assessment & Plan:  Acute cough Assessment & Plan: HPI and exam today suspicious for pneumonia.  Stat chest x-ray ordered and pending. Start doxycycline 100 mg twice daily x 7 days. Start  Benzonatate capsules for cough. Take 1 capsule by mouth three times daily as needed for cough.  Work note provided. She will update.  Orders: -     DG Chest 2 View -     Doxycycline Hyclate; Take 1 tablet (100 mg total) by mouth 2 (two) times daily.  Dispense: 14 tablet; Refill: 0 -  Benzonatate; Take 1 capsule (200 mg total) by mouth 3 (three) times daily as needed for cough.  Dispense: 15 capsule; Refill: 0        Comer MARLA Gaskins, NP

## 2024-03-16 NOTE — Assessment & Plan Note (Signed)
 HPI and exam today suspicious for pneumonia.  Stat chest x-ray ordered and pending. Start doxycycline 100 mg twice daily x 7 days. Start Benzonatate capsules for cough. Take 1 capsule by mouth three times daily as needed for cough.  Work note provided. She will update.

## 2024-03-20 MED ORDER — HYDROCOD POLI-CHLORPHE POLI ER 10-8 MG/5ML PO SUER
5.0000 mL | Freq: Two times a day (BID) | ORAL | 0 refills | Status: DC | PRN
Start: 2024-03-20 — End: 2024-05-02

## 2024-05-02 ENCOUNTER — Encounter: Payer: Self-pay | Admitting: Primary Care

## 2024-05-02 ENCOUNTER — Ambulatory Visit: Admitting: Primary Care

## 2024-05-02 ENCOUNTER — Ambulatory Visit: Payer: Self-pay | Admitting: Primary Care

## 2024-05-02 ENCOUNTER — Other Ambulatory Visit: Payer: Self-pay | Admitting: Primary Care

## 2024-05-02 VITALS — BP 130/86 | HR 96 | Temp 98.1°F | Ht 63.0 in | Wt 246.0 lb

## 2024-05-02 DIAGNOSIS — K219 Gastro-esophageal reflux disease without esophagitis: Secondary | ICD-10-CM | POA: Diagnosis not present

## 2024-05-02 DIAGNOSIS — L299 Pruritus, unspecified: Secondary | ICD-10-CM | POA: Diagnosis not present

## 2024-05-02 DIAGNOSIS — Z6841 Body Mass Index (BMI) 40.0 and over, adult: Secondary | ICD-10-CM | POA: Diagnosis not present

## 2024-05-02 DIAGNOSIS — E66813 Obesity, class 3: Secondary | ICD-10-CM | POA: Diagnosis not present

## 2024-05-02 LAB — COMPREHENSIVE METABOLIC PANEL WITH GFR
ALT: 20 U/L (ref 0–35)
AST: 16 U/L (ref 0–37)
Albumin: 4.2 g/dL (ref 3.5–5.2)
Alkaline Phosphatase: 58 U/L (ref 39–117)
BUN: 11 mg/dL (ref 6–23)
CO2: 27 meq/L (ref 19–32)
Calcium: 9.1 mg/dL (ref 8.4–10.5)
Chloride: 102 meq/L (ref 96–112)
Creatinine, Ser: 0.61 mg/dL (ref 0.40–1.20)
GFR: 102.58 mL/min (ref >60.00)
Glucose, Bld: 111 mg/dL — ABNORMAL HIGH (ref 70–99)
Potassium: 4.1 meq/L (ref 3.5–5.1)
Sodium: 138 meq/L (ref 135–145)
Total Bilirubin: 0.5 mg/dL (ref 0.2–1.2)
Total Protein: 7.2 g/dL (ref 6.0–8.3)

## 2024-05-02 LAB — CBC WITH DIFFERENTIAL/PLATELET
Basophils Absolute: 0 K/uL (ref 0.0–0.1)
Basophils Relative: 0.7 % (ref 0.0–3.0)
Eosinophils Absolute: 0.1 K/uL (ref 0.0–0.7)
Eosinophils Relative: 3.4 % (ref 0.0–5.0)
HCT: 40 % (ref 36.0–46.0)
Hemoglobin: 13.3 g/dL (ref 12.0–15.0)
Lymphocytes Relative: 34 % (ref 12.0–46.0)
Lymphs Abs: 1.3 K/uL (ref 0.7–4.0)
MCHC: 33.3 g/dL (ref 30.0–36.0)
MCV: 92 fl (ref 78.0–100.0)
Monocytes Absolute: 0.2 K/uL (ref 0.1–1.0)
Monocytes Relative: 5.6 % (ref 3.0–12.0)
Neutro Abs: 2.1 K/uL (ref 1.4–7.7)
Neutrophils Relative %: 56.3 % (ref 43.0–77.0)
Platelets: 284 K/uL (ref 150.0–400.0)
RBC: 4.35 Mil/uL (ref 3.87–5.11)
RDW: 13.5 % (ref 11.5–15.5)
WBC: 3.8 K/uL — ABNORMAL LOW (ref 4.0–10.5)

## 2024-05-02 LAB — HEMOGLOBIN A1C: Hgb A1c MFr Bld: 6.2 % (ref 4.6–6.5)

## 2024-05-02 MED ORDER — SEMAGLUTIDE-WEIGHT MANAGEMENT 0.25 MG/0.5ML ~~LOC~~ SOAJ: 0.2500 mg | SUBCUTANEOUS | 0 refills | Status: DC

## 2024-05-02 MED ORDER — OMEPRAZOLE 20 MG PO CPDR: 20.0000 mg | DELAYED_RELEASE_CAPSULE | Freq: Two times a day (BID) | ORAL | 0 refills | Status: DC

## 2024-05-02 NOTE — Patient Instructions (Signed)
 Stop taking Claritin.  Start taking Zyrtec 10 mg daily for itching. Start famotidine 20 mg daily for itching.  Stop by the lab prior to leaving today. I will notify you of your results once received.   Start semaglutide  (Wegovy ) for weight loss. Start by injecting 0.25 mg into the skin once weekly for 4 weeks, then increase to 0.5 mg once weekly thereafter.  Please notify me once you have used your last 0.25 mg pen so that I can send the 0.5 mg dose to your pharmacy.  It was a pleasure to see you today!

## 2024-05-02 NOTE — Progress Notes (Signed)
 Subjective:    Patient ID: Jill Meyers, female    DOB: March 30, 1971, 53 y.o.   MRN: [id number]  Gastroesophageal Reflux She reports no wheezing.    Jill Meyers is a very pleasant 53 y.o. female with a history of GERD, chronic back pain, chronic neck pain, obesity who presents today to discuss several concerns.  1) GERD: Currently managed on omeprazole  20 mg BID since early July 2025 for persistent cough. Since initiation she's noticed a resolve in her acute cough. She's also noticed that her GERD symptoms have resolved. She does notice her symptoms start to return if she misses a dose.    2) Pruritus: Her itching is located to the bilateral palmar and dorsal hands including fingers, bilateral lower extremities and dorsal and plantar feet, and anterior chest which began about 6 days ago.  She has also noticed small bumpy rashes to her hands, upper extremities, anterior chest, and lower extremities.   She denies changes in medications, soaps/detergents, supplements, recent tick bites, new foods, recent yard work. She's been taking Benadryl  which helps temporarily. She's also applied hydrocortisone  cream without improvement.   She resumed her loratadine 10 mg daily for the last 5 days.   3) Obesity: Chronic for years. Gradual weight gain over the years. She was once managed on phentermine and did well, but her weight returned when she stopped taking it. She has never tried GLP 1 agonist treatment. She believes perimenopause has caused her weight gain due to increased food cravings including sugar.   Diet currently consists of:  Breakfast: Vending machine food, banana  Lunch: Sandwich from home, tuna packet, fruit, chips Dinner: Sandwich, salad, take out food, fast food Snacks: snack cakes, donuts, crackers and cheese, yogurt Desserts: Daily Beverages: Water with flavoring  Exercise: None   Body mass index is 43.58 kg/m.   Wt Readings from Last 3 Encounters:  05/02/24 246 lb (111.6  kg)  03/16/24 238 lb (108 kg)  03/13/24 243 lb (110.2 kg)     Review of Systems  HENT:  Negative for trouble swallowing.   Respiratory:  Negative for wheezing.   Endocrine: Positive for polyphagia. Negative for polydipsia and polyuria.  Skin:  Positive for rash.       Itching          Past Medical History:  Diagnosis Date   Allergic rhinitis    Chickenpox    Constipation    Kidney stones    Panic attacks    Prediabetes    UTI (urinary tract infection)     Social History   Socioeconomic History   Marital status: Married    Spouse name: Not on file   Number of children: Not on file   Years of education: Not on file   Highest education level: Associate degree: occupational, Scientist, product/process development, or vocational program  Occupational History   Not on file  Tobacco Use   Smoking status: Never   Smokeless tobacco: Never  Substance and Sexual Activity   Alcohol use: Not Currently   Drug use: Not on file   Sexual activity: Not on file  Other Topics Concern   Not on file  Social History Narrative   Married.   Works as a Scientist, clinical (histocompatibility and immunogenetics).    Social Drivers of Health   Financial Resource Strain: Low Risk  (03/13/2024)   Overall Financial Resource Strain (CARDIA)    Difficulty of Paying Living Expenses: Not very hard  Food Insecurity: No Food Insecurity (03/13/2024)   Hunger Vital Sign  Worried About Programme researcher, broadcasting/film/video in the Last Year: Never true    Ran Out of Food in the Last Year: Never true  Transportation Needs: No Transportation Needs (03/13/2024)   PRAPARE - Administrator, Civil Service (Medical): No    Lack of Transportation (Non-Medical): No  Physical Activity: Inactive (03/13/2024)   Exercise Vital Sign    Days of Exercise per Week: 0 days    Minutes of Exercise per Session: Not on file  Stress: Stress Concern Present (03/13/2024)   [employer] of Occupational Health - Occupational Stress Questionnaire    Feeling of Stress: Rather much  Social  Connections: Moderately Integrated (03/13/2024)   Social Connection and Isolation Panel    Frequency of Communication with Friends and Family: More than three times a week    Frequency of Social Gatherings with Friends and Family: Once a week    Attends Religious Services: More than 4 times per year    Active Member of [employer] or Organizations: No    Attends Engineer, structural: Not on file    Marital Status: Married  Intimate Partner Violence: Unknown (12/22/2021)   Received from [facility]   HITS    Physically Hurt: Not on file    Insult or Talk Down To: Not on file    Threaten Physical Harm: Not on file    Scream or Curse: Not on file    Past Surgical History:  Procedure Laterality Date   CHOLECYSTECTOMY  1996   LITHOTRIPSY     uterine polyp removal  2023    Family History  Problem Relation Age of Onset   Arthritis Mother    Hyperlipidemia Mother    Breast cancer Mother    Hyperlipidemia Father    Hypertension Father    Stroke Father    Throat cancer Father    Alcohol abuse Father    Arthritis Maternal Grandmother    Hearing loss Maternal Grandmother    Arthritis Maternal Grandfather    Heart disease Maternal Grandfather    Hypertension Maternal Grandfather     Allergies  Allergen Reactions   Prednisone  Swelling    Facial swelling   Amoxicillin  Other (See Comments)    Causes UTI    Ciprofloxacin Other (See Comments)    Unknown, found from records   Tramadol Nausea Only    Current Outpatient Medications on File Prior to Visit  Medication Sig Dispense Refill   diphenhydrAMINE  HCl (BENADRYL  ALLERGY  PO)      loratadine (CLARITIN) 10 MG tablet Take 10 mg by mouth daily.     No current facility-administered medications on file prior to visit.    BP 130/86   Pulse 96   Temp 98.1 F (36.7 C) (Temporal)   Ht 5' 3 (1.6 m)   Wt 246 lb (111.6 kg)   LMP 12/21/2023 (Exact Date)   SpO2 98%   BMI 43.58 kg/m  Objective:   Physical  Exam Cardiovascular:     Rate and Rhythm: Normal rate and regular rhythm.  Pulmonary:     Effort: Pulmonary effort is normal.     Breath sounds: Normal breath sounds.  Musculoskeletal:     Cervical back: Neck supple.  Skin:    General: Skin is warm and dry.     Findings: Rash present.     Comments: Several pinpoint slightly raised mildly red bumps to bilateral hands, upper extremities, lower extremities, anterior chest.    Neurological:     Mental Status:  She is alert and oriented to person, place, and time.  Psychiatric:        Mood and Affect: Mood normal.           Assessment & Plan:  Gastroesophageal reflux disease, unspecified whether esophagitis present Assessment & Plan: Improved.  Continue omeprazole  20 mg twice daily for now. We will work towards weight loss to help improve symptoms so that she does not rely on PPI therapy.  Orders: -     Omeprazole ; Take 1 capsule (20 mg total) by mouth 2 (two) times daily before a meal. for heartburn.  Dispense: 180 capsule; Refill: 0  Pruritus Assessment & Plan: Unclear etiology.  Exam and HPI today inconsistent for shingles, poison ivy dermatitis, scabies.  Labs pending today including the food allergy  panel, A1c, CBC, alpha gal, CMP.  Stop Claritin. Start Zyrtec 10 mg daily. Start famotidine 20 mg daily.  She has a severe allergy  to prednisone .  She will update.  Orders: -     Food Allergy  Profile -     Alpha-Gal Panel -     CBC with Differential/Platelet -     Comprehensive metabolic panel with GFR  Class 3 severe obesity due to excess calories with body mass index (BMI) of 40.0 to 44.9 in adult Assessment & Plan: Strongly advised that she limit intake of fast food sugary snacks. Checking labs today.  Discussed options for treatment.  We decided to try GLP-1 agonist treatment.  We discussed instructions for use and potential side effects.  Start semaglutide  (Wegovy ) for weight loss. Start by injecting  0.25 mg into the skin once weekly for 4 weeks, then increase to 0.5 mg once weekly thereafter.  Please notify me once you have used your last 0.25 mg pen so that I can send the 0.5 mg dose to your pharmacy.  If insurance will not cover, then recommend referral to healthy weight and wellness.   Follow up in 2-3 months.    Orders: -     Semaglutide -Weight Management; Inject 0.25 mg into the skin once a week.  Dispense: 2 mL; Refill: 0 -     Hemoglobin A1c        Comer MARLA Gaskins, NP

## 2024-05-02 NOTE — Assessment & Plan Note (Signed)
 Improved.  Continue omeprazole  20 mg twice daily for now. We will work towards weight loss to help improve symptoms so that she does not rely on PPI therapy.

## 2024-05-02 NOTE — Assessment & Plan Note (Addendum)
 Unclear etiology.  Exam and HPI today inconsistent for shingles, poison ivy dermatitis, scabies.  Labs pending today including the food allergy  panel, A1c, CBC, alpha gal, CMP.  Stop Claritin. Start Zyrtec 10 mg daily. Start famotidine 20 mg daily.  She has a severe allergy  to prednisone.  She will update.

## 2024-05-02 NOTE — Assessment & Plan Note (Addendum)
 Strongly advised that she limit intake of fast food sugary snacks. Checking labs today.  Discussed options for treatment.  We decided to try GLP-1 agonist treatment.  We discussed instructions for use and potential side effects.  Start semaglutide  (Wegovy ) for weight loss. Start by injecting 0.25 mg into the skin once weekly for 4 weeks, then increase to 0.5 mg once weekly thereafter.  Please notify me once you have used your last 0.25 mg pen so that I can send the 0.5 mg dose to your pharmacy.  If insurance will not cover, then recommend referral to healthy weight and wellness.   Follow up in 2-3 months.

## 2024-05-04 DIAGNOSIS — L299 Pruritus, unspecified: Secondary | ICD-10-CM

## 2024-05-04 DIAGNOSIS — R21 Rash and other nonspecific skin eruption: Secondary | ICD-10-CM

## 2024-05-04 MED ORDER — HYDROXYZINE HCL 10 MG PO TABS: 10.0000 mg | ORAL_TABLET | Freq: Three times a day (TID) | ORAL | 0 refills | Status: AC | PRN

## 2024-05-04 NOTE — Telephone Encounter (Signed)
 Kelli, I placed an urgent referral for Dr. Shona dermatology.

## 2024-05-04 NOTE — Telephone Encounter (Signed)
 Patient called in to inquire if this is completed, advised that the provider has not had time to address the mychart message, will send message back for script to be called in.

## 2024-05-06 NOTE — Telephone Encounter (Signed)
 Kelli, I placed an urgent referral to allergy . Can we get her seen this week if she cannot get into dermatology?

## 2024-05-07 NOTE — Telephone Encounter (Signed)
 STAT team notified regarding urgent allergy  and Dermatology referral.

## 2024-05-08 LAB — ALPHA-GAL PANEL
Allergen, Mutton, f88: 0.11 kU/L — ABNORMAL HIGH
Allergen, Pork, f26: <0.1 kU/L
Beef: <0.1 kU/L
CLASS: 0
CLASS: 0
GALACTOSE-ALPHA-1,3-GALACTOSE IGE*: <0.1 kU/L (ref <0.10)

## 2024-05-08 LAB — FOOD ALLERGY PROFILE

## 2024-05-08 LAB — INTERPRETATION:

## 2024-05-10 ENCOUNTER — Encounter: Payer: Self-pay | Admitting: Allergy & Immunology

## 2024-05-10 ENCOUNTER — Ambulatory Visit: Admitting: Allergy & Immunology

## 2024-05-10 ENCOUNTER — Other Ambulatory Visit: Payer: Self-pay

## 2024-05-10 VITALS — BP 140/90 | HR 111 | Temp 98.7°F | Ht 63.0 in | Wt 242.6 lb

## 2024-05-10 DIAGNOSIS — L508 Other urticaria: Secondary | ICD-10-CM

## 2024-05-10 DIAGNOSIS — L299 Pruritus, unspecified: Secondary | ICD-10-CM | POA: Diagnosis not present

## 2024-05-10 DIAGNOSIS — T7840XD Allergy, unspecified, subsequent encounter: Secondary | ICD-10-CM

## 2024-05-10 NOTE — Patient Instructions (Addendum)
 1. Chronic urticaria - Your history does not have any red flags such as fevers, joint pains, or permanent skin changes that would be concerning for a more serious cause of hives.  - We will get some additional labs to rule out serious causes of hives: tryptase level, chronic urticaria panel, chronic urticaria assay, thyroid  antibodies, ANA, ESR, and CRP.  - Chronic hives are often times a self limited process and will burn themselves out over 6-12 months, although this is not always the case.  - In the meantime, start suppressive dosing of antihistamines:   - Morning: Allegra (fexofenadine) 180 - 360mg  + Pepcid (famotidine) 20mg   - Evening: Zyrtec (cetirizine) 10 - 20mg  + Pepcid (famotidine) 20mg  - You can change this dosing at home, decreasing the dose as needed or increasing the dosing as needed.  - I do NOT think that the famotidine has anything to do with causing your hives and in fact famotidine can HELP with the hives.  - Take the hjydroxyzine at night if needed (since this causes so much sleepiness).  - Continue with the triamcinolone 0.1% ointment twice daily applied thinly for 1-2 weeks (mix with an over the counter moisturizer as well).   2. Penicillin allergy  - Since this has been so long, I would just bring you in for a penicillin challenge. - We do have testing available, but we only do it for patients who have had severe symptoms (needing hospitalization and epinephrine and other anaphylaxis symptoms) - There is a shortage of the testing supplies for penicillin, so we have just been doing direct challenges instead. - We will bring you in and do a graded challenge.   3. Return in about 4 weeks (around 06/07/2024). You can have the follow up appointment with Dr. Iva or a Nurse Practicioner (our Nurse Practitioners are excellent and always have Physician oversight!).    Please inform us  of any Emergency Department visits, hospitalizations, or changes in symptoms. Call us   before going to the ED for breathing or allergy  symptoms since we might be able to fit you in for a sick visit. Feel free to contact us  anytime with any questions, problems, or concerns.  It was a pleasure to meet you today!  Websites that have reliable patient information: 1. American Academy of Asthma, Allergy , and Immunology: www.aaaai.org 2. Food Allergy  Research and Education (FARE): foodallergy.org 3. Mothers of Asthmatics: http://www.asthmacommunitynetwork.org 4. [employer] of Allergy , Asthma, and Immunology: www.acaai.org      "Like" us  on Facebook and Instagram for our latest updates!      A healthy democracy works best when [employer] participate! Make sure you are registered to vote! If you have moved or changed any of your contact information, you will need to get this updated before voting! Scan the QR codes below to learn more!

## 2024-05-10 NOTE — Progress Notes (Unsigned)
 NEW PATIENT  Date of Service/Encounter:  05/10/24  Consult requested by: Gretta Comer POUR, NP   Assessment:   Pruritic condition   Allergic reaction - unknown trigger  Chronic urticaria   Plan/Recommendations:   1. Chronic urticaria - Your history does not have any red flags such as fevers, joint pains, or permanent skin changes that would be concerning for a more serious cause of hives.  - We will get some additional labs to rule out serious causes of hives: tryptase level, chronic urticaria panel, chronic urticaria assay, thyroid  antibodies, ANA, ESR, and CRP.  - Chronic hives are often times a self limited process and will burn themselves out over 6-12 months, although this is not always the case.  - In the meantime, start suppressive dosing of antihistamines:   - Morning: Allegra (fexofenadine) 180 - 360mg  + Pepcid (famotidine) 20mg   - Evening: Zyrtec (cetirizine) 10 - 20mg  + Pepcid (famotidine) 20mg  - You can change this dosing at home, decreasing the dose as needed or increasing the dosing as needed.  - I do NOT think that the famotidine has anything to do with causing your hives and in fact famotidine can HELP with the hives.  - Take the hjydroxyzine at night if needed (since this causes so much sleepiness).  - Continue with the triamcinolone 0.1% ointment twice daily applied thinly for 1-2 weeks (mix with an over the counter moisturizer as well).   2. Penicillin allergy  - Since this has been so long, I would just bring you in for a penicillin challenge. - We do have testing available, but we only do it for patients who have had severe symptoms (needing hospitalization and epinephrine and other anaphylaxis symptoms) - There is a shortage of the testing supplies for penicillin, so we have just been doing direct challenges instead. - We will bring you in and do a graded challenge.   3. Return in about 4 weeks (around 06/07/2024). You can have the follow up appointment  with Dr. Iva or a Nurse Practicioner (our Nurse Practitioners are excellent and always have Physician oversight!).    This note in its entirety was forwarded to the Provider who requested this consultation.  Subjective:   Jill Meyers is a 53 y.o. female presenting today for evaluation of  Chief Complaint  Patient presents with   Urticaria   Rash   Pruritus   Allergic Reaction    Unknown cause    Jill Meyers has a history of the following: Patient Active Problem List   Diagnosis Date Noted   Pruritus 05/02/2024   Gastroesophageal reflux disease 05/02/2024   Acute cough 03/16/2024   Nasal congestion 03/13/2024   Chronic neck pain 10/18/2023   Hemorrhoids 03/10/2023   Left lower quadrant abdominal pain 02/23/2023   Urinary frequency 02/23/2023   Tinnitus 07/08/2022   Snoring 05/12/2022   Adjustment reaction with anxiety and depression 03/31/2022   Encounter for annual general medical examination with abnormal findings in adult 03/31/2022   Chronic joint pain 03/26/2022   Dizziness 03/26/2022   Localized skin mass, lump, or swelling 08/05/2021   Left upper quadrant abdominal pain 05/13/2021   Acute left-sided thoracic back pain 05/13/2021   Chronic pain of right heel 10/20/2020   Flank pain 10/16/2019   Chronic back pain 08/11/2018   Class 3 severe obesity due to excess calories with body mass index (BMI) of 40.0 to 44.9 in adult 08/11/2018    History obtained from: chart review and patient and her husband.  Discussed the use of AI scribe software for clinical note transcription with the patient and/or guardian, who gave verbal consent to proceed.  Jill Meyers was referred by Gretta Comer POUR, NP.     Jill Meyers is a 53 y.o. female presenting for an evaluation of pruritus and urticaria.  She has been experiencing severe itching since August 7th, which has progressively worsened and affects various parts of her body. The itching is described as severe, and  scratching leads to bumps on her skin. No hives are present. The itching is most severe on her chest, hands, feet, and heels, particularly in the morning.  She initially consulted her nurse practitioner, who conducted a full panel of blood work, including food allergy  testing. Omeprazole , which she had been taking for acid reflux since July 10th, was stopped on August 4th due to suspicion of it being a potential cause. Despite discontinuing omeprazole , the itching persisted.  She was prescribed hydroxyzine , initially at 10 mg, which was later increased to 20 mg due to continued itching. She also uses a lotion every 12 hours and takes Zyrtec at night to manage drowsiness. Despite these treatments, she continues to experience significant itching.  There is a family history of penicillin allergy , affecting her father, daughter, and grandfather. She recalls a past adverse reaction to amoxicillin , which caused a yeast infection and flu-like symptoms, though she did not experience breathing problems or require hospitalization.  She has a history of adverse reactions to prednisone, including facial swelling and increased eye pressure, following both oral and injectable forms. She has not used steroid ointments before but has been prescribed triamcinolone cream, which she started using recently.  She is currently experiencing perimenopausal symptoms, including sleep disturbances and mood swings, which are exacerbated by the itching. She is unable to proceed with scheduled Achilles surgery until her itching is resolved.     Otherwise, there is no history of other atopic diseases, including asthma, food allergies, drug allergies, environmental allergies, stinging insect allergies, or contact dermatitis. There is no significant infectious history. Vaccinations are up to date.    Past Medical History: Patient Active Problem List   Diagnosis Date Noted   Pruritus 05/02/2024   Gastroesophageal reflux disease  05/02/2024   Acute cough 03/16/2024   Nasal congestion 03/13/2024   Chronic neck pain 10/18/2023   Hemorrhoids 03/10/2023   Left lower quadrant abdominal pain 02/23/2023   Urinary frequency 02/23/2023   Tinnitus 07/08/2022   Snoring 05/12/2022   Adjustment reaction with anxiety and depression 03/31/2022   Encounter for annual general medical examination with abnormal findings in adult 03/31/2022   Chronic joint pain 03/26/2022   Dizziness 03/26/2022   Localized skin mass, lump, or swelling 08/05/2021   Left upper quadrant abdominal pain 05/13/2021   Acute left-sided thoracic back pain 05/13/2021   Chronic pain of right heel 10/20/2020   Flank pain 10/16/2019   Chronic back pain 08/11/2018   Class 3 severe obesity due to excess calories with body mass index (BMI) of 40.0 to 44.9 in adult 08/11/2018    Medication List:  Allergies as of 05/10/2024       Reactions   Prednisone Swelling   Facial swelling   Amoxicillin  Other (See Comments)   Causes UTI    Ciprofloxacin Other (See Comments)   Unknown, found from records   Tramadol Nausea Only        Medication List        Accurate as of May 10, 2024 11:59 PM. If you  have any questions, ask your nurse or doctor.          hydrOXYzine  10 MG tablet Commonly known as: ATARAX  Take 1 tablet (10 mg total) by mouth 3 (three) times daily as needed for itching.   loratadine 10 MG tablet Commonly known as: CLARITIN Take 10 mg by mouth daily.   omeprazole  40 MG capsule Commonly known as: PRILOSEC Take 1 capsule (40 mg total) by mouth daily. for heartburn.   triamcinolone cream 0.1 % Commonly known as: KENALOG Apply topically.   ZyrTEC Allergy  10 MG Caps Generic drug: Cetirizine HCl Take by oral route.        Birth History: non-contributory  Developmental History: non-contributory  Past Surgical History: Past Surgical History:  Procedure Laterality Date   CHOLECYSTECTOMY  1996   LITHOTRIPSY     uterine  polyp removal  2023     Family History: Family History  Problem Relation Age of Onset   Arthritis Mother    Hyperlipidemia Mother    Breast cancer Mother    Hyperlipidemia Father    Hypertension Father    Stroke Father    Throat cancer Father    Alcohol abuse Father    Arthritis Maternal Grandmother    Hearing loss Maternal Grandmother    Arthritis Maternal Grandfather    Heart disease Maternal Grandfather    Hypertension Maternal Grandfather      Social History: Matisse lives at home with her husband. She is not a smoker. There are no animals in the home. There are no new exposures at all.    Review of systems otherwise negative other than that mentioned in the HPI.    Objective:   Blood pressure (!) 140/90, pulse (!) 111, temperature 98.7 F (37.1 C), height 5' 3 (1.6 m), weight 242 lb 9.6 oz (110 kg), last menstrual period 12/21/2023, SpO2 96%. Body mass index is 42.97 kg/m.     Physical Exam Vitals reviewed.  Constitutional:      Appearance: She is well-developed.     Comments: Speaking in full sentences. Very anxious.   HENT:     Head: Normocephalic and atraumatic.     Right Ear: Tympanic membrane, ear canal and external ear normal. No drainage, swelling or tenderness. Tympanic membrane is not injected, scarred, erythematous, retracted or bulging.     Left Ear: Tympanic membrane, ear canal and external ear normal. No drainage, swelling or tenderness. Tympanic membrane is not injected, scarred, erythematous, retracted or bulging.     Nose: No nasal deformity, septal deviation, mucosal edema or rhinorrhea.     Right Sinus: No maxillary sinus tenderness or frontal sinus tenderness.     Left Sinus: No maxillary sinus tenderness or frontal sinus tenderness.     Mouth/Throat:     Mouth: Mucous membranes are not pale and not dry.     Pharynx: Uvula midline.  Eyes:     General:        Right eye: No discharge.        Left eye: No discharge.     Conjunctiva/sclera:  Conjunctivae normal.     Right eye: Right conjunctiva is not injected. No chemosis.    Left eye: Left conjunctiva is not injected. No chemosis.    Pupils: Pupils are equal, round, and reactive to light.  Cardiovascular:     Rate and Rhythm: Normal rate and regular rhythm.     Heart sounds: Normal heart sounds.  Pulmonary:     Effort: Pulmonary effort is normal.  No tachypnea, accessory muscle usage or respiratory distress.     Breath sounds: Normal breath sounds. No wheezing, rhonchi or rales.  Chest:     Chest wall: No tenderness.  Abdominal:     Tenderness: There is no abdominal tenderness. There is no guarding or rebound.  Lymphadenopathy:     Head:     Right side of head: No submandibular, tonsillar or occipital adenopathy.     Left side of head: No submandibular, tonsillar or occipital adenopathy.     Cervical: No cervical adenopathy.  Skin:    General: Skin is warm.     Capillary Refill: Capillary refill takes less than 2 seconds.     Coloration: Skin is not pale.     Findings: Rash present. No abrasion, erythema or petechiae. Rash is urticarial. Rash is not papular or vesicular.     Comments: She does have excoriations present over her entire body. She has dermatographism present.   Neurological:     Mental Status: She is alert.      Diagnostic studies: labs sent instead          Marty Shaggy, MD [facility] 

## 2024-05-11 ENCOUNTER — Encounter: Payer: Self-pay | Admitting: Allergy & Immunology

## 2024-05-14 ENCOUNTER — Telehealth: Payer: Self-pay

## 2024-05-14 NOTE — Telephone Encounter (Signed)
 Received onbase communication from [facility]; confirming lab in chart.

## 2024-05-15 ENCOUNTER — Other Ambulatory Visit: Payer: Self-pay | Admitting: Orthopedic Surgery

## 2024-05-16 ENCOUNTER — Telehealth: Payer: Self-pay | Admitting: Allergy & Immunology

## 2024-05-16 ENCOUNTER — Encounter: Payer: Self-pay | Admitting: Allergy & Immunology

## 2024-05-16 MED ORDER — PREDNISONE 10 MG PO TABS: ORAL_TABLET | ORAL | 0 refills | Status: DC

## 2024-05-16 NOTE — Telephone Encounter (Signed)
 Make sure that she is doubling the Allegra in the morning and the Zyrtec at night.   She can double up the hydroxyzine  at night as well.   I sent in prednisone  in case she needs to add that on.   Marty Shaggy, MD [facility] 

## 2024-05-16 NOTE — Telephone Encounter (Signed)
 PT called regarding persistent itching despite utilizing medicinal instructions provided by Iva - PT is hoping to be seen soon again to discuss any updates - scheduled apt in [place] for this Friday

## 2024-05-17 ENCOUNTER — Telehealth: Payer: Self-pay

## 2024-05-17 LAB — THYROID PEROXIDASE ANTIBODY: Thyroperoxidase Ab SerPl-aCnc: 16 [IU]/mL (ref 0–34)

## 2024-05-17 LAB — ANTINUCLEAR ANTIBODIES, IFA

## 2024-05-17 LAB — ANTI-TPO AB (RDL)

## 2024-05-17 LAB — C-REACTIVE PROTEIN: CRP: 9 mg/L (ref 0–10)

## 2024-05-17 LAB — CHRONIC URTICARIA PD-BAT: Pooled Donor- BAT CU: 4.8 (ref 0.00–10.60)

## 2024-05-17 LAB — SEDIMENTATION RATE: Sed Rate: 41 mm/h — ABNORMAL HIGH (ref 0–40)

## 2024-05-17 LAB — TRYPTASE: Tryptase: 3 ug/L (ref 2.2–13.2)

## 2024-05-17 NOTE — Telephone Encounter (Signed)
 Spoke with patient via mychart. See mychart encounter.

## 2024-05-17 NOTE — Telephone Encounter (Signed)
 Copied from CRM #[id number]. Topic: Clinical - Medication Question >> May 17, 2024  9:48 AM Chiquita SQUIBB wrote: Reason for CRM: Patient was prescribed prednisone  by her allergy  doctor and is prone to yeast infections, so the pharmacist told her she would need a prescription for Diflucan  also so she does not get a yeast infection before her upcoming surgery. Patient is asking if [facility] can call this in because she has not heard back from the allergy  doctor. Please advise patient.

## 2024-05-18 ENCOUNTER — Encounter: Payer: Self-pay | Admitting: Allergy & Immunology

## 2024-05-18 ENCOUNTER — Ambulatory Visit: Admitting: Allergy & Immunology

## 2024-05-18 ENCOUNTER — Ambulatory Visit: Payer: Self-pay | Admitting: Allergy & Immunology

## 2024-05-18 VITALS — BP 138/88 | HR 102 | Temp 98.2°F | Resp 18

## 2024-05-18 DIAGNOSIS — L299 Pruritus, unspecified: Secondary | ICD-10-CM

## 2024-05-18 DIAGNOSIS — T7840XD Allergy, unspecified, subsequent encounter: Secondary | ICD-10-CM | POA: Diagnosis not present

## 2024-05-18 DIAGNOSIS — L508 Other urticaria: Secondary | ICD-10-CM | POA: Diagnosis not present

## 2024-05-18 MED ORDER — FLUCONAZOLE 150 MG PO TABS: 150.0000 mg | ORAL_TABLET | Freq: Once | ORAL | 0 refills | Status: AC

## 2024-05-18 NOTE — Telephone Encounter (Signed)
 Addressed during OV.   Marty Shaggy, MD [facility] 

## 2024-05-18 NOTE — Patient Instructions (Addendum)
 1. Chronic urticaria - I would definitely restart the antihistamines (since your symptoms are going to definitely get worse as you get off of the prednisone ). - I would get some more labs to look for even weirder causes of itching/rash.  - In the meantime, RESTART suppressive dosing of antihistamines:   - Morning: Allegra (fexofenadine) 180 - 360mg  + Pepcid (famotidine) 20mg   - Evening: Zyrtec (cetirizine) 10 - 20mg  + Pepcid (famotidine) 20mg  - Take the hydroxyzine  at night if needed (since this causes so much sleepiness).  - Continue with the triamcinolone 0.1% ointment twice daily applied thinly for 1-2 weeks (mix with an over the counter moisturizer as well).  - Consider Dupixent or Xolair for long-term control.   - Handouts for each provided.   2. Penicillin allergy  - Since this has been so long, I would just bring you in for a penicillin challenge. - We do have testing available, but we only do it for patients who have had severe symptoms (needing hospitalization and epinephrine and other anaphylaxis symptoms) - There is a shortage of the testing supplies for penicillin, so we have just been doing direct challenges instead. - We will bring you in and do a graded challenge.   3. Return in about 4 weeks (around 06/15/2024). You can have the follow up appointment with Dr. Iva or a Nurse Practicioner (our Nurse Practitioners are excellent and always have Physician oversight!). I want to make sure that you are doing fine before your big surgery.    Please inform us  of any Emergency Department visits, hospitalizations, or changes in symptoms. Call us  before going to the ED for breathing or allergy  symptoms since we might be able to fit you in for a sick visit. Feel free to contact us  anytime with any questions, problems, or concerns.  It was a pleasure to meet you today!  Websites that have reliable patient information: 1. American Academy of Asthma, Allergy , and Immunology:  www.aaaai.org 2. Food Allergy  Research and Education (FARE): foodallergy.org 3. Mothers of Asthmatics: http://www.asthmacommunitynetwork.org 4. American College of Allergy , Asthma, and Immunology: www.acaai.org      "Like" us  on Facebook and Instagram for our latest updates!      A healthy democracy works best when [employer] participate! Make sure you are registered to vote! If you have moved or changed any of your contact information, you will need to get this updated before voting! Scan the QR codes below to learn more!

## 2024-05-18 NOTE — Progress Notes (Signed)
 FOLLOW UP  Date of Service/Encounter:  05/18/24   Assessment:   Pruritic condition    Allergic reaction - unknown trigger   Chronic urticaria   Plan/Recommendations:   1. Chronic urticaria - I would definitely restart the antihistamines (since your symptoms are going to definitely get worse as you get off of the prednisone ). - I would get some more labs to look for even weirder causes of itching/rash.  - In the meantime, RESTART suppressive dosing of antihistamines:   - Morning: Allegra (fexofenadine) 180 - 360mg  + Pepcid (famotidine) 20mg   - Evening: Zyrtec (cetirizine) 10 - 20mg  + Pepcid (famotidine) 20mg  - Take the hydroxyzine  at night if needed (since this causes so much sleepiness).  - Continue with the triamcinolone 0.1% ointment twice daily applied thinly for 1-2 weeks (mix with an over the counter moisturizer as well).  - Consider Dupixent or Xolair for long-term control.   - Handouts for each provided.   2. Penicillin allergy  - Since this has been so long, I would just bring you in for a penicillin challenge. - We do have testing available, but we only do it for patients who have had severe symptoms (needing hospitalization and epinephrine and other anaphylaxis symptoms) - There is a shortage of the testing supplies for penicillin, so we have just been doing direct challenges instead. - We will bring you in and do a graded challenge.   3. Return in about 4 weeks (around 06/15/2024). You can have the follow up appointment with Dr. Iva or a Nurse Practicioner (our Nurse Practitioners are excellent and always have Physician oversight!). I want to make sure that you are doing fine before your big surgery.   Subjective:   Jill Meyers is a 53 y.o. female presenting today for follow up of  Chief Complaint  Patient presents with   Pruritus   Rash    Jill Meyers has a history of the following: Patient Active Problem List   Diagnosis Date Noted   Pruritus  05/02/2024   Gastroesophageal reflux disease 05/02/2024   Acute cough 03/16/2024   Nasal congestion 03/13/2024   Chronic neck pain 10/18/2023   Hemorrhoids 03/10/2023   Left lower quadrant abdominal pain 02/23/2023   Urinary frequency 02/23/2023   Tinnitus 07/08/2022   Snoring 05/12/2022   Adjustment reaction with anxiety and depression 03/31/2022   Encounter for annual general medical examination with abnormal findings in adult 03/31/2022   Chronic joint pain 03/26/2022   Dizziness 03/26/2022   Localized skin mass, lump, or swelling 08/05/2021   Left upper quadrant abdominal pain 05/13/2021   Acute left-sided thoracic back pain 05/13/2021   Chronic pain of right heel 10/20/2020   Flank pain 10/16/2019   Chronic back pain 08/11/2018   Class 3 severe obesity due to excess calories with body mass index (BMI) of 40.0 to 44.9 in adult 08/11/2018    History obtained from: chart review and patient.  Discussed the use of AI scribe software for clinical note transcription with the patient and/or guardian, who gave verbal consent to proceed.  Jill Meyers is a 53 y.o. female presenting for a follow up visit.  She was last seen in August 2025.  At that time, we obtain labs to look for serious causes of hives.  We started her on Allegra 1 to 2 tablets in the morning combination with Pepcid as well as Zyrtec 1 to 2 tablets in the evening and combination with Pepcid.  We recommended doing the hydroxyzine  at night.  We continue with triamcinolone ointment twice daily as needed.  For her penicillin allergy , we recommended doing a graded challenge to remove this.  Since the last visit, she has continued to have some problems with hives.   She has been experiencing persistent itching and rash since April 26, 2024, which began on her hands and has spread to her back and feet. The rash consists of numerous small dots, and the itching is severe, particularly at night. Despite taking hydroxyzine , Allegra, Pepcid,  and Zyrtec, she continues to experience breakthrough itching.  She started prednisone  on Thursday morning and has since experienced side effects, including vision changes and headaches. She uses Tylenol  for headaches. No systemic symptoms like fever or weight loss. No environmental allergy  symptoms such as sneezing, and the rash is not present on her face.  She has a history of very low positive alpha-gal test and was advised to stop omeprazole  and switch to Pepcid, which did not alleviate her symptoms. A dermatologist recommended continuing hydroxyzine  and using medicated cream.  She is scheduled for Achilles surgery on June 21, 2024. 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  Her social history includes working at [employer], and she has been experiencing stress related to her upcoming surgery, which she speculates may be contributing to her symptoms.     Otherwise, there have been no changes to her past medical history, surgical history, family history, or social history.    Review of systems otherwise negative other than that mentioned in the HPI.    Objective:   Blood pressure 138/88, pulse (!) 102, temperature 98.2 F (36.8 C), resp. rate 18, last menstrual period 12/21/2023, SpO2 95%. There is no height or weight on file to calculate BMI.    Physical Exam Vitals reviewed.  Constitutional:      Appearance: She is well-developed.     Comments: Speaking in full sentences. Very anxious.   HENT:     Head: Normocephalic and atraumatic.     Right Ear: Tympanic membrane, ear canal and external ear normal. No drainage, swelling or tenderness. Tympanic membrane is not injected, scarred, erythematous, retracted or bulging.     Left Ear: Tympanic membrane, ear canal and external ear normal. No drainage, swelling or tenderness. Tympanic membrane is not injected, scarred, erythematous, retracted or bulging.     Nose: No nasal deformity, septal  deviation, mucosal edema or rhinorrhea.     Right Sinus: No maxillary sinus tenderness or frontal sinus tenderness.     Left Sinus: No maxillary sinus tenderness or frontal sinus tenderness.     Mouth/Throat:     Mouth: Mucous membranes are not pale and not dry.     Pharynx: Uvula midline.  Eyes:     General:        Right eye: No discharge.        Left eye: No discharge.     Conjunctiva/sclera: Conjunctivae normal.     Right eye: Right conjunctiva is not injected. No chemosis.    Left eye: Left conjunctiva is not injected. No chemosis.    Pupils: Pupils are equal, round, and reactive to light.  Cardiovascular:     Rate and Rhythm: Normal rate and regular rhythm.     Heart sounds: Normal heart sounds.  Pulmonary:     Effort: Pulmonary effort is normal. No tachypnea, accessory muscle usage or respiratory distress.     Breath sounds: Normal breath sounds. No wheezing, rhonchi or rales.  Chest:  Chest wall: No tenderness.  Lymphadenopathy:     Head:     Right side of head: No submandibular, tonsillar or occipital adenopathy.     Left side of head: No submandibular, tonsillar or occipital adenopathy.     Cervical: No cervical adenopathy.  Skin:    General: Skin is warm.     Capillary Refill: Capillary refill takes less than 2 seconds.     Coloration: Skin is not pale.     Findings: Rash present. No abrasion, erythema or petechiae. Rash is urticarial. Rash is not papular or vesicular.     Comments: She does have excoriations present over her entire body. She has dermatographism present.   Neurological:     Mental Status: She is alert.      Diagnostic studies: none       Marty Shaggy, MD  [facility] 

## 2024-05-22 ENCOUNTER — Encounter: Payer: Self-pay | Admitting: Allergy & Immunology

## 2024-05-23 LAB — ALLERGENS W/COMP RFLX AREA 2
Alternaria Alternata IgE: <0.1 kU/L
Aspergillus Fumigatus IgE: <0.1 kU/L
Bermuda Grass IgE: <0.1 kU/L
Cedar, Mountain IgE: <0.1 kU/L
Cladosporium Herbarum IgE: <0.1 kU/L
Cockroach, German IgE: <0.1 kU/L
Common Silver Birch IgE: <0.1 kU/L
Cottonwood IgE: <0.1 kU/L
D Farinae IgE: <0.1 kU/L
D Pteronyssinus IgE: <0.1 kU/L
E001-IgE Cat Dander: <0.1 kU/L
E005-IgE Dog Dander: 0.18 kU/L — AB
Elm, American IgE: <0.1 kU/L
IgE (Immunoglobulin E), Serum: 59 [IU]/mL (ref 6–495)
Johnson Grass IgE: <0.1 kU/L
Maple/Box Elder IgE: <0.1 kU/L
Mouse Urine IgE: <0.1 kU/L
Oak, White IgE: <0.1 kU/L
Pecan, Hickory IgE: <0.1 kU/L
Penicillium Chrysogen IgE: 0.14 kU/L — AB
Pigweed, Rough IgE: <0.1 kU/L
Ragweed, Short IgE: <0.1 kU/L
Sheep Sorrel IgE Qn: <0.1 kU/L
Timothy Grass IgE: <0.1 kU/L
White Mulberry IgE: <0.1 kU/L

## 2024-05-23 LAB — PROTEIN ELECTROPHORESIS, SERUM, WITH REFLEX
A/G Ratio: 1.2 (ref 0.7–1.7)
Albumin ELP: 4 g/dL (ref 2.9–4.4)
Alpha 1: 0.3 g/dL (ref 0.0–0.4)
Alpha 2: 0.8 g/dL (ref 0.4–1.0)
Beta: 1.2 g/dL (ref 0.7–1.3)
Gamma Globulin: 1.2 g/dL (ref 0.4–1.8)
Globulin, Total: 3.4 g/dL (ref 2.2–3.9)
Total Protein: 7.4 g/dL (ref 6.0–8.5)

## 2024-05-23 LAB — SEDIMENTATION RATE: Sed Rate: 57 mm/h — ABNORMAL HIGH (ref 0–40)

## 2024-05-23 LAB — UPEP/TP, 24-HR URINE

## 2024-05-23 LAB — C-REACTIVE PROTEIN: CRP: 4 mg/L (ref 0–10)

## 2024-05-24 LAB — UPEP/TP, 24-HR URINE
Albumin, U: 17.9
Alpha 1, Urine: 5.7
Alpha 2, Urine: 16.9
Beta, Urine: 29.2
Gamma Globulin, Urine: 30.3
Protein, 24H Urine: 101 mg/(24.h) (ref 30–150)
Protein, Ur: 6.7 mg/dL

## 2024-05-25 ENCOUNTER — Telehealth: Payer: Self-pay | Admitting: Allergy & Immunology

## 2024-05-25 ENCOUNTER — Encounter: Payer: Self-pay | Admitting: Allergy & Immunology

## 2024-05-25 ENCOUNTER — Ambulatory Visit: Payer: Self-pay | Admitting: Allergy & Immunology

## 2024-05-25 LAB — HM MAMMOGRAPHY

## 2024-05-25 NOTE — Telephone Encounter (Signed)
 Called patient back- DOB/DPR verified - stated the following:  Completed Prednisone  10 mg on Tuesday, 05/22/24 - begin noticing pimples/bumps on her chest area (and between her breast), back, stomach and neck.  Reviewed the below provider office notes from 05/18/24 w/patient:   Chronic urticaria - I would definitely restart the antihistamines (since your symptoms are going to definitely get worse as you get off of the prednisone ). - I would get some more labs to look for even weirder causes of itching/rash.  - In the meantime, RESTART suppressive dosing of antihistamines:              - Morning: Allegra (fexofenadine) 180 - 360mg  + Pepcid (famotidine) 20mg              - Evening: Zyrtec (cetirizine) 10 - 20mg  + Pepcid (famotidine) 20mg  - Take the hydroxyzine  at night if needed (since this causes so much sleepiness).  - Continue with the triamcinolone 0.1% ointment twice daily applied thinly for 1-2 weeks (mix with an over the counter moisturizer as well).  - Consider Dupixent or Xolair for long-term control.   - Handouts for each provided.   Patient stated/confirmed she has been following all provider directions/instructions.  Patient advised to take pictures of the areas - send to provider via her myChart - she will be contacted via her myChart or telephone from him or staff with his recommendations/advice.  Patient verbalized understanding to all, no further questions.  Forwarding message to provider.

## 2024-05-25 NOTE — Telephone Encounter (Signed)
 error

## 2024-05-29 ENCOUNTER — Telehealth: Payer: Self-pay | Admitting: *Deleted

## 2024-05-29 ENCOUNTER — Encounter: Payer: Self-pay | Admitting: Primary Care

## 2024-05-29 NOTE — Telephone Encounter (Signed)
 Called patient and advised she did not want to start Xolair therapy she is unhappy with how her messages have benn handled over the weekend and she doesn't want to start another medication. She does plan to keep her appt with Dr Iva on 9/16.

## 2024-05-29 NOTE — Telephone Encounter (Signed)
 Noted

## 2024-05-30 NOTE — Telephone Encounter (Signed)
 I wonder what happened - I guess I will apologize next week when she comes in.

## 2024-06-05 ENCOUNTER — Other Ambulatory Visit: Payer: Self-pay

## 2024-06-05 ENCOUNTER — Ambulatory Visit: Admitting: Allergy & Immunology

## 2024-06-05 ENCOUNTER — Encounter: Payer: Self-pay | Admitting: Allergy & Immunology

## 2024-06-05 VITALS — BP 132/108 | HR 101 | Temp 98.4°F | Resp 16 | Ht 62.25 in | Wt 247.4 lb

## 2024-06-05 DIAGNOSIS — L508 Other urticaria: Secondary | ICD-10-CM

## 2024-06-05 DIAGNOSIS — T7840XD Allergy, unspecified, subsequent encounter: Secondary | ICD-10-CM

## 2024-06-05 NOTE — Progress Notes (Unsigned)
 FOLLOW UP  Date of Service/Encounter:  06/05/24   Assessment:   Pruritic condition    Allergic reaction - unknown trigger   Chronic urticaria   Scheduled for Achilles tendon repair in October 2025  Plan/Recommendations:   1. Chronic urticaria - I am glad that the itching/rash is better.  - Sometimes prednisone  can cause acne breakouts, so this might be related to it (keep using the acne cream).  - You could also buy Differin gel to help with it: - You did have elevated inflammatory markers, so let's trend that over time (maybe we will recheck at the next visit).  - I would get some more labs to look for even weirder causes of itching/rash.  - In the meantime, continue suppressive dosing of antihistamines:   - Morning: Allegra (fexofenadine) 180 - 360mg  + Pepcid (famotidine) 20mg   - Evening: Allegra (fexofenadine) 180 - 360mg  + Pepcid (famotidine) 20mg  - Take the hydroxyzine  at night if needed (since this causes so much sleepiness).  - Continue with the triamcinolone 0.1% ointment twice daily applied thinly for 1-2 weeks (mix with an over the counter moisturizer as well).  - Feel free to decrease the Pepcid to twice daily.  - Consider Dupixent or Xolair for long-term control.   - Handouts for each provided.   2. Penicillin allergy  - We need to do a challenge at some point.  3. Return in about 3 months (around 09/04/2024). You can have the follow up appointment with Dr. Iva or a Nurse Practicioner (our Nurse Practitioners are excellent and always have Physician oversight!). I want to make sure that you are doing fine before your big surgery.    Subjective:   Jill Meyers is a 53 y.o. female presenting today for follow up of  Chief Complaint  Patient presents with   Follow-up   Rash    It is better    Jill Meyers has a history of the following: Patient Active Problem List   Diagnosis Date Noted   Pruritus 05/02/2024   Gastroesophageal reflux disease  05/02/2024   Acute cough 03/16/2024   Nasal congestion 03/13/2024   Chronic neck pain 10/18/2023   Hemorrhoids 03/10/2023   Left lower quadrant abdominal pain 02/23/2023   Urinary frequency 02/23/2023   Tinnitus 07/08/2022   Snoring 05/12/2022   Adjustment reaction with anxiety and depression 03/31/2022   Encounter for annual general medical examination with abnormal findings in adult 03/31/2022   Chronic joint pain 03/26/2022   Dizziness 03/26/2022   Localized skin mass, lump, or swelling 08/05/2021   Left upper quadrant abdominal pain 05/13/2021   Acute left-sided thoracic back pain 05/13/2021   Chronic pain of right heel 10/20/2020   Flank pain 10/16/2019   Chronic back pain 08/11/2018   Class 3 severe obesity due to excess calories with body mass index (BMI) of 40.0 to 44.9 in adult 08/11/2018    History obtained from: chart review and patient.  Discussed the use of AI scribe software for clinical note transcription with the patient and/or guardian, who gave verbal consent to proceed.  Jill Meyers is a 53 y.o. female presenting for a follow up visit.  We last saw her in August 2025.  At that time, we just recommended restarting the antihistamines.  We obtain labs to look for weird or causes of itching and hives.  We talked about Dupixent or Xolair for long-term control.  For the penicillin allergy , we recommended doing a penicillin challenge.  Since last visit, she has done  very well.  She developed a skin rash that initially improved but was followed by acne-like lesions on her chest, face, neck, and back. The lesions are painful, with some having pustular heads. The rash began after completing a six-day course of prednisone  for Achilles tendon inflammation. Blurred vision and skin breakout occurred two days post-prednisone . She is now off of prednisone  completely.  She manages the rash and acne with over-the-counter benzoyl peroxide 10% applied thrice daily, which has reduced redness  but not resolved the acne. She uses a medicated cream for itching as needed, with significant reduction in itching over the past four days. Her current medications include Allegra in the morning and evening, Pepcid twice daily, and Zyrtec in the evening, though she is considering discontinuing Zyrtec due to drowsiness.  She has a history of elevated blood pressure, with a recent diastolic reading of 108. She experiences heart palpitations, described as a sensation of her heart 'beating' and 'pressure' in her chest, associated with anxiety and stress related to her father's advanced Parkinson's disease and her upcoming Achilles tendon surgery.  She is scheduled for Achilles tendon surgery on October 2nd and is concerned about managing her medications around the time of surgery. Her father is in hospice care due to advanced Parkinson's disease. She lives in [place], close to [place], and recently purchased a new car.     Otherwise, there have been no changes to her past medical history, surgical history, family history, or social history.    Review of systems otherwise negative other than that mentioned in the HPI.    Objective:   Blood pressure (!) 132/108, pulse (!) 101, temperature 98.4 F (36.9 C), temperature source Temporal, resp. rate 16, height 5' 2.25 (1.581 m), weight 247 lb 6.4 oz (112.2 kg), SpO2 97%. Body mass index is 44.89 kg/m.    Physical Exam Vitals reviewed.  Constitutional:      Appearance: She is well-developed.     Comments: Speaking in full sentences. Very anxious.   HENT:     Head: Normocephalic and atraumatic.     Right Ear: Tympanic membrane, ear canal and external ear normal. No drainage, swelling or tenderness. Tympanic membrane is not injected, scarred, erythematous, retracted or bulging.     Left Ear: Tympanic membrane, ear canal and external ear normal. No drainage, swelling or tenderness. Tympanic membrane is not injected, scarred, erythematous,  retracted or bulging.     Nose: No nasal deformity, septal deviation, mucosal edema or rhinorrhea.     Right Sinus: No maxillary sinus tenderness or frontal sinus tenderness.     Left Sinus: No maxillary sinus tenderness or frontal sinus tenderness.     Mouth/Throat:     Mouth: Mucous membranes are not pale and not dry.     Pharynx: Uvula midline.  Eyes:     General:        Right eye: No discharge.        Left eye: No discharge.     Conjunctiva/sclera: Conjunctivae normal.     Right eye: Right conjunctiva is not injected. No chemosis.    Left eye: Left conjunctiva is not injected. No chemosis.    Pupils: Pupils are equal, round, and reactive to light.  Cardiovascular:     Rate and Rhythm: Normal rate and regular rhythm.     Heart sounds: Normal heart sounds.  Pulmonary:     Effort: Pulmonary effort is normal. No tachypnea, accessory muscle usage or respiratory distress.     Breath sounds:  Normal breath sounds. No wheezing, rhonchi or rales.  Chest:     Chest wall: No tenderness.  Lymphadenopathy:     Head:     Right side of head: No submandibular, tonsillar or occipital adenopathy.     Left side of head: No submandibular, tonsillar or occipital adenopathy.     Cervical: No cervical adenopathy.  Skin:    General: Skin is warm.     Capillary Refill: Capillary refill takes less than 2 seconds.     Coloration: Skin is not pale.     Findings: Rash present. No abrasion, erythema or petechiae. Rash is not papular, urticarial or vesicular.     Comments: Skin much more clear today.  She still has what looks like acne on her upper chest.  Neurological:     Mental Status: She is alert.      Diagnostic studies: none      Marty Shaggy, MD  [facility] 

## 2024-06-05 NOTE — Patient Instructions (Addendum)
 1. Chronic urticaria - I am glad that the itching/rash is better.  - Sometimes prednisone  can cause acne breakouts, so this might be related to it (keep using the acne cream).  - You could also buy Differin gel to help with it:   - You did have elevated inflammatory markers, so let's trend that over time (maybe we will recheck at the next visit).  - I would get some more labs to look for even weirder causes of itching/rash.  - In the meantime, continue suppressive dosing of antihistamines:   - Morning: Allegra (fexofenadine) 180 - 360mg  + Pepcid (famotidine) 20mg   - Evening: Allegra (fexofenadine) 180 - 360mg  + Pepcid (famotidine) 20mg  - Take the hydroxyzine  at night if needed (since this causes so much sleepiness).  - Continue with the triamcinolone 0.1% ointment twice daily applied thinly for 1-2 weeks (mix with an over the counter moisturizer as well).  - Feel free to decrease the Pepcid to twice daily.  - Consider Dupixent or Xolair for long-term control.   - Handouts for each provided.   2. Penicillin allergy  - We need to do a challenge at some point.  3. Return in about 3 months (around 09/04/2024). You can have the follow up appointment with Dr. Iva or a Nurse Practicioner (our Nurse Practitioners are excellent and always have Physician oversight!). I want to make sure that you are doing fine before your big surgery.    Please inform us  of any Emergency Department visits, hospitalizations, or changes in symptoms. Call us  before going to the ED for breathing or allergy  symptoms since we might be able to fit you in for a sick visit. Feel free to contact us  anytime with any questions, problems, or concerns.  It was a pleasure to meet you today!  Websites that have reliable patient information: 1. American Academy of Asthma, Allergy , and Immunology: www.aaaai.org 2. Food Allergy  Research and Education (FARE): foodallergy.org 3. Mothers of Asthmatics:  http://www.asthmacommunitynetwork.org 4. American College of Allergy , Asthma, and Immunology: www.acaai.org      "Like" us  on Facebook and Instagram for our latest updates!      A healthy democracy works best when [employer] participate! Make sure you are registered to vote! If you have moved or changed any of your contact information, you will need to get this updated before voting! Scan the QR codes below to learn more!

## 2024-06-06 ENCOUNTER — Ambulatory Visit: Admitting: Nurse Practitioner

## 2024-06-06 ENCOUNTER — Ambulatory Visit: Payer: Self-pay

## 2024-06-06 ENCOUNTER — Ambulatory Visit: Attending: Nurse Practitioner

## 2024-06-06 ENCOUNTER — Encounter: Payer: Self-pay | Admitting: Nurse Practitioner

## 2024-06-06 VITALS — BP 134/86 | HR 102 | Temp 97.9°F | Ht 63.0 in | Wt 247.0 lb

## 2024-06-06 DIAGNOSIS — R030 Elevated blood-pressure reading, without diagnosis of hypertension: Secondary | ICD-10-CM

## 2024-06-06 DIAGNOSIS — R0789 Other chest pain: Secondary | ICD-10-CM

## 2024-06-06 DIAGNOSIS — R002 Palpitations: Secondary | ICD-10-CM

## 2024-06-06 LAB — BASIC METABOLIC PANEL WITH GFR
BUN: 8 mg/dL (ref 6–23)
CO2: 29 meq/L (ref 19–32)
Calcium: 9.2 mg/dL (ref 8.4–10.5)
Chloride: 103 meq/L (ref 96–112)
Creatinine, Ser: 0.63 mg/dL (ref 0.40–1.20)
GFR: 101.72 mL/min
Glucose, Bld: 113 mg/dL — ABNORMAL HIGH (ref 70–99)
Potassium: 4.3 meq/L (ref 3.5–5.1)
Sodium: 139 meq/L (ref 135–145)

## 2024-06-06 LAB — CBC
HCT: 37.9 % (ref 36.0–46.0)
Hemoglobin: 12.7 g/dL (ref 12.0–15.0)
MCHC: 33.4 g/dL (ref 30.0–36.0)
MCV: 91.6 fl (ref 78.0–100.0)
Platelets: 293 K/uL (ref 150.0–400.0)
RBC: 4.14 Mil/uL (ref 3.87–5.11)
RDW: 13.7 % (ref 11.5–15.5)
WBC: 3.3 K/uL — ABNORMAL LOW (ref 4.0–10.5)

## 2024-06-06 LAB — TROPONIN I (HIGH SENSITIVITY): High Sens Troponin I: <2 ng/L (ref 2–17)

## 2024-06-06 LAB — TSH: TSH: 2.02 u[IU]/mL (ref 0.35–5.50)

## 2024-06-06 NOTE — Telephone Encounter (Signed)
 Noted, will evaluate.

## 2024-06-06 NOTE — Patient Instructions (Signed)
 Nice to see you today EKG looked ok in office I will be in touch with the blood I have ordered the heart monitor Follow up with Mallie a scheduled

## 2024-06-06 NOTE — Telephone Encounter (Signed)
 FYI Only or Action Required?: Action required by provider: update on patient condition.  Patient was last seen in primary care on 05/02/2024 by Gretta Comer POUR, NP.  Called Nurse Triage reporting Hypertension.  Symptoms began yesterday at appointment.  Interventions attempted: Nothing.  Symptoms are: unchanged.  Triage Disposition: See PCP Within 2 Weeks  Patient/caregiver understands and will follow disposition?: Yes                Copied from CRM [id number]. Topic: Clinical - Red Word Triage >> Jun 06, 2024  7:59 AM [place] A wrote: [facility] that prompted transfer to Nurse Triage: Patient has rash in crease of groin area that is painful, patient has high heart rate 147/108 Reason for Disposition  [1] Systolic BP >= 130 OR Diastolic >= 80 AND [2] not taking BP medications  Answer Assessment - Initial Assessment Questions Yesterday at allergy  doctor appointment---blood pressure found to be elevated and patient has a surgery coming up and she is nervous about her blood pressure being elevated now. She wanted to get this taken care of prior to that and wanted to come in today if there was any availability 142/102 during triage with this RN   1. BLOOD PRESSURE: What is your blood pressure? Did you take at least two measurements 5 minutes apart?     148/108 at allergy  appointment then 142/106 later in this appointment Checked around 8pm last night 152/110 Patient has had some personal stressors with 156/113  2. ONSET: When did you take your blood pressure?     142/102 about an hour prior to triage 3. HOW: How did you take your blood pressure? (e.g., automatic home BP monitor, visiting nurse)     Home cuff 4. HISTORY: Do you have a history of high blood pressure?     Never been diagnosed 5. MEDICINES: Are you taking any medicines for blood pressure? Have you missed any doses recently?     no 6. OTHER SYMPTOMS: Do you have any symptoms? (e.g., blurred  vision, chest pain, difficulty breathing, headache, weakness)     Patient states she feels jittery  Protocols used: Blood Pressure - High-A-AH

## 2024-06-06 NOTE — Progress Notes (Signed)
 Acute Office Visit  Subjective:     Patient ID: Jill Meyers, female    DOB: 1971/07/28, 53 y.o.   MRN: [id number]  Chief Complaint  Patient presents with   Elevated Blood Pressure    C/o elevated BP readings due to feeling anxious. Has pre-op OV with Mallie 05/08/24.    HPI Patient is in today for elevated BP and anxiety with a history of GERD, dizziness, Adjustment reaction with anxiety and depression  Discussed the use of AI scribe software for clinical note transcription with the patient, who gave verbal consent to proceed.  History of Present Illness Jill Meyers is a 53 year old female with hypertension who presents with elevated blood pressure and palpitations.  She has been experiencing elevated blood pressure readings, initially noted during a recent visit to an allergist, with measurements of 138/108 mmHg and 140/106 mmHg. She has been monitoring her blood pressure at home, noting variability in her readings. She feels nervous and anxious, which she believes may contribute to the elevated readings.  She describes intermittent palpitations, characterized by a sensation of her heart skipping beats, accompanied by a dry cough. These symptoms occur sporadically and are associated with a sensation of pressure in her chest, described as feeling like a 'balloon.'   She is scheduled for Achilles tendon surgery on October 2nd and is concerned about her current symptoms affecting the surgery. She has experienced inflammation and arthritis in various areas, with previous treatments, including physical therapy and nitroglycerin patches, providing no relief.  She reports perimenopausal symptoms, including weight gain and hormonal changes, which she believes may contribute to her current symptoms. No alcohol use or smoking. Her father has a history of stage four esophageal larynx cancer.  During the review of symptoms, she reports a dry cough, intermittent palpitations, and chest pressure.  No shortness of breath, lightheadedness, dizziness, or fever. She notes a recent episode of visual changes, describing her vision as 'heavy' and feeling tired.   Review of Systems  Constitutional:  Negative for chills and fever.  Eyes:  Positive for blurred vision (eyes heavy).  Respiratory:  Negative for shortness of breath.   Cardiovascular:  Positive for chest pain and palpitations.  Neurological:  Negative for dizziness and headaches.  Psychiatric/Behavioral:  Negative for hallucinations and suicidal ideas.         Objective:    BP 134/86   Pulse (!) 102   Temp 97.9 F (36.6 C) (Oral)   Ht 5' 3 (1.6 m)   Wt 247 lb (112 kg)   LMP 12/30/2023   SpO2 97%   BMI 43.75 kg/m  BP Readings from Last 3 Encounters:  06/06/24 134/86  06/05/24 (!) 132/108  05/18/24 138/88   Wt Readings from Last 3 Encounters:  06/06/24 247 lb (112 kg)  06/05/24 247 lb 6.4 oz (112.2 kg)  05/10/24 242 lb 9.6 oz (110 kg)   SpO2 Readings from Last 3 Encounters:  06/06/24 97%  06/05/24 97%  05/18/24 95%      Physical Exam Vitals and nursing note reviewed.  Constitutional:      Appearance: Normal appearance.  Cardiovascular:     Rate and Rhythm: Normal rate and regular rhythm.     Heart sounds: Normal heart sounds.  Pulmonary:     Effort: Pulmonary effort is normal.     Breath sounds: Normal breath sounds.  Neurological:     Mental Status: She is alert.     No results found for any visits on  06/06/24.      Assessment & Plan:   Problem List Items Addressed This Visit   None Visit Diagnoses       Elevated blood pressure reading    -  Primary     Palpitations       Relevant Orders   EKG 12-Lead   CBC   Basic metabolic panel with GFR   TSH   LONG TERM MONITOR XT (3-14 DAYS)     Chest discomfort       Relevant Orders   Troponin I (High Sensitivity)      Assessment and Plan Assessment & Plan Intermittent palpitations and chest pressure with elevated blood pressure  readings Differential includes atrial fibrillation, SVT, or anxiety. Blood pressure variable. Carotid artery ultrasound showed no significant blockage. Discussed potential paroxysmal dysrhythmias and need for further investigation. - Order Holter monitor for two weeks to assess for dysrhythmias. - Perform EKG to assess current heart rhythm. - Advise to check blood pressure once daily, sitting calmly for five minutes before measurement. - Advise to avoid caffeine and alcohol. - Discuss with surgeon regarding heart monitoring and surgery implications. - Order blood tests for underlying issues.  Anxiety symptoms Anxiety may contribute to palpitations and elevated blood pressure. Discussed importance of ruling out other causes before attributing symptoms to anxiety. - Advise to reduce stress and avoid caffeine. - Discuss anxiety's potential contribution to symptoms and explore management strategies.  Achilles tendinitis, planned for surgery Achilles tendinitis with surgery planned for October 2nd. Previous treatments ineffective. Discussed heart monitoring during surgery and potential need to delay if significant issues arise. - Discuss with surgeon regarding heart monitoring and surgery impact. - Proceed with planned surgery on October 2nd unless advised otherwise.  Generalized inflammation (laboratory finding) Generalized inflammation noted. Cause unclear, possibly related to Achilles tendinitis or arthritis. Inflammation markers elevated, specific cause undetermined. - Review recent blood work for inflammation markers.   No orders of the defined types were placed in this encounter.   Return if symptoms worsen or fail to improve, for As scheduled .  Adina Crandall, NP

## 2024-06-08 ENCOUNTER — Encounter: Payer: Self-pay | Admitting: Primary Care

## 2024-06-08 ENCOUNTER — Ambulatory Visit: Admitting: Primary Care

## 2024-06-08 ENCOUNTER — Ambulatory Visit: Payer: Self-pay | Admitting: Nurse Practitioner

## 2024-06-08 VITALS — BP 134/86 | HR 94 | Temp 97.6°F | Ht 63.0 in | Wt 248.0 lb

## 2024-06-08 DIAGNOSIS — Z01818 Encounter for other preprocedural examination: Secondary | ICD-10-CM | POA: Insufficient documentation

## 2024-06-08 DIAGNOSIS — L299 Pruritus, unspecified: Secondary | ICD-10-CM | POA: Diagnosis not present

## 2024-06-08 DIAGNOSIS — R030 Elevated blood-pressure reading, without diagnosis of hypertension: Secondary | ICD-10-CM | POA: Insufficient documentation

## 2024-06-08 DIAGNOSIS — R002 Palpitations: Secondary | ICD-10-CM | POA: Diagnosis not present

## 2024-06-08 NOTE — Assessment & Plan Note (Signed)
 Reviewed ECG and labs from September 2025.  Will monitor white blood cell counts.  Approved for surgery.

## 2024-06-08 NOTE — Assessment & Plan Note (Signed)
 Proceed with Holter monitor as planned.  Reviewed ECG and labs from September 2025.

## 2024-06-08 NOTE — Patient Instructions (Signed)
 Good luck with surgery.  It was a pleasure to see you today!

## 2024-06-08 NOTE — Assessment & Plan Note (Signed)
 Improved.   Continue famotidine 20 mg BID, Allegra 160 mg in AM, Zyrtec 10 mg in PM. Consider reducing her regimen post operatively

## 2024-06-08 NOTE — Progress Notes (Signed)
 Subjective:    Patient ID: Jill Meyers, female    DOB: [birth year], 53 y.o.   MRN: [id number]  Jill Meyers is a very pleasant 53 y.o. female with a history of GERD, pruritus, chronic back pain, chronic right heel pain,  who presents today for preoperative clearance.   She is undergoing recession of gastrocnemius muscle per Dr. Kit on 06/21/24.   Evaluated by Adina, NP on 06/06/24 for elevated BP readings 138/108, 140/106. She has noticed intermittent palpitations skipped beats, chest pressure, weight gain, perimenopausal symptoms for which she believed to be related to her recent symptoms.  During her visit she underwent EKG, lab work such as CBC, BMP, TSH, troponin.  Holter monitor was ordered for her palpitations.  Her EKG was negative for acute ischemia.  Her lab work was grossly normal.  She has yet to receive her Holter monitor.  She has not noticed her palpitations or symptoms from last visit.  She believes she may have situational anxiety. She continues to struggle with left lower extremity pain.   She is checking her BP at home which is running mostly 130-160/90-100s.  Her pruritus has improved. She's taking famotidine 20 mg BID, Allegra in the morning and Zyrtec in the evening. She is using her hydroxyzine  as needed.      BP Readings from Last 3 Encounters:  06/08/24 134/86  06/06/24 134/86  06/05/24 (!) 132/108    Wt Readings from Last 3 Encounters:  06/08/24 248 lb (112.5 kg)  06/06/24 247 lb (112 kg)  06/05/24 247 lb 6.4 oz (112.2 kg)       Review of Systems  Constitutional:  Negative for fever.  HENT:  Negative for congestion.   Eyes:  Negative for visual disturbance.  Respiratory:  Positive for chest tightness. Negative for shortness of breath.   Cardiovascular:  Negative for chest pain and palpitations.  Gastrointestinal:  Negative for constipation and diarrhea.  Genitourinary:  Negative for difficulty urinating.  Musculoskeletal:  Positive for  arthralgias and myalgias.  Skin:  Negative for rash.  Neurological:  Negative for dizziness and headaches.  Psychiatric/Behavioral:  The patient is nervous/anxious.          Past Medical History:  Diagnosis Date   Acute left-sided thoracic back pain 05/13/2021   Allergic rhinitis    Chickenpox    Constipation    Kidney stones    Panic attacks    Prediabetes    UTI (urinary tract infection)     Social History   Socioeconomic History   Marital status: Married    Spouse name: Not on file   Number of children: Not on file   Years of education: Not on file   Highest education level: Associate degree: occupational, Scientist, product/process development, or vocational program  Occupational History   Not on file  Tobacco Use   Smoking status: Never   Smokeless tobacco: Never  Substance and Sexual Activity   Alcohol use: Not Currently   Drug use: Not on file   Sexual activity: Not on file  Other Topics Concern   Not on file  Social History Narrative   Married.   Works as a Scientist, clinical (histocompatibility and immunogenetics).    Social Drivers of Corporate investment banker Strain: Low Risk  (03/13/2024)   Overall Financial Resource Strain (CARDIA)    Difficulty of Paying Living Expenses: Not very hard  Food Insecurity: No Food Insecurity (03/13/2024)   Hunger Vital Sign    Worried About Running Out of Food in the Last  Year: Never true    Ran Out of Food in the Last Year: Never true  Transportation Needs: No Transportation Needs (03/13/2024)   PRAPARE - Administrator, Civil Service (Medical): No    Lack of Transportation (Non-Medical): No  Physical Activity: Inactive (03/13/2024)   Exercise Vital Sign    Days of Exercise per Week: 0 days    Minutes of Exercise per Session: Not on file  Stress: Stress Concern Present (03/13/2024)   [employer] of Occupational Health - Occupational Stress Questionnaire    Feeling of Stress: Rather much  Social Connections: Moderately Integrated (03/13/2024)   Social Connection  and Isolation Panel    Frequency of Communication with Friends and Family: More than three times a week    Frequency of Social Gatherings with Friends and Family: Once a week    Attends Religious Services: More than 4 times per year    Active Member of [employer] or Organizations: No    Attends Engineer, structural: Not on file    Marital Status: Married  Intimate Partner Violence: Unknown (12/22/2021)   Received from [facility]   HITS    Physically Hurt: Not on file    Insult or Talk Down To: Not on file    Threaten Physical Harm: Not on file    Scream or Curse: Not on file    Past Surgical History:  Procedure Laterality Date   CHOLECYSTECTOMY  1996   LITHOTRIPSY     uterine polyp removal  2023    Family History  Problem Relation Age of Onset   Arthritis Mother    Hyperlipidemia Mother    Breast cancer Mother    Hyperlipidemia Father    Hypertension Father    Stroke Father    Throat cancer Father    Alcohol abuse Father    Arthritis Maternal Grandmother    Hearing loss Maternal Grandmother    Arthritis Maternal Grandfather    Heart disease Maternal Grandfather    Hypertension Maternal Grandfather     Allergies  Allergen Reactions   Prednisone  Swelling    Facial swelling   Amoxicillin  Other (See Comments)    Causes UTI    Ciprofloxacin Other (See Comments)    Unknown, found from records   Tramadol Nausea Only    Current Outpatient Medications on File Prior to Visit  Medication Sig Dispense Refill   Cetirizine HCl (ZYRTEC ALLERGY ) 10 MG CAPS Take by oral route.     hydrOXYzine  (ATARAX ) 10 MG tablet Take 1 tablet (10 mg total) by mouth 3 (three) times daily as needed for itching. 30 tablet 0   triamcinolone cream (KENALOG) 0.1 % Apply topically.     No current facility-administered medications on file prior to visit.    BP 134/86   Pulse 94   Temp 97.6 F (36.4 C) (Temporal)   Ht 5' 3 (1.6 m)   Wt 248 lb (112.5 kg)   LMP 12/30/2023   SpO2 100%    BMI 43.93 kg/m  Objective:   Physical Exam Cardiovascular:     Rate and Rhythm: Normal rate and regular rhythm.  Pulmonary:     Effort: Pulmonary effort is normal.     Breath sounds: Normal breath sounds.  Musculoskeletal:     Cervical back: Neck supple.  Skin:    General: Skin is warm and dry.  Neurological:     Mental Status: She is alert and oriented to person, place, and time.  Psychiatric:  Mood and Affect: Mood normal.     Physical Exam        Assessment & Plan:  Pruritus Assessment & Plan: Improved.   Continue famotidine 20 mg BID, Allegra 160 mg in AM, Zyrtec 10 mg in PM. Consider reducing her regimen post operatively    Palpitations Assessment & Plan: Proceed with Holter monitor as planned.  Reviewed ECG and labs from September 2025.   Preoperative clearance Assessment & Plan: Reviewed ECG and labs from September 2025.  Will monitor white blood cell counts.  Approved for surgery.    Elevated blood pressure reading without diagnosis of hypertension Assessment & Plan: Overall okay today, home readings are too high. She does have risk factors.   We will continue to monitor her blood pressure. She will follow-up with us  after surgery.     Assessment and Plan Assessment & Plan         Comer MARLA Gaskins, NP     History of Present Illness

## 2024-06-08 NOTE — Assessment & Plan Note (Signed)
 Overall okay today, home readings are too high. She does have risk factors.   We will continue to monitor her blood pressure. She will follow-up with us  after surgery.

## 2024-06-14 ENCOUNTER — Other Ambulatory Visit: Payer: Self-pay

## 2024-06-14 ENCOUNTER — Encounter (HOSPITAL_BASED_OUTPATIENT_CLINIC_OR_DEPARTMENT_OTHER): Payer: Self-pay | Admitting: Orthopedic Surgery

## 2024-06-15 NOTE — Progress Notes (Signed)

## 2024-06-20 NOTE — H&P (Signed)
 Jill Meyers is an 53 y.o. female.   Chief Complaint: left ankle pain HPI: 53 y/o female with a long h/o left posterior ankle pain c/o worsening pain over many months despite extensive non op treatment.  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  She presents today for surgical treatment.   Past Medical History:  Diagnosis Date   Acute left-sided thoracic back pain 05/13/2021   Allergic rhinitis    Chickenpox    Constipation    Kidney stones    Panic attacks    Prediabetes    UTI (urinary tract infection)     Past Surgical History:  Procedure Laterality Date   CHOLECYSTECTOMY  1996   LITHOTRIPSY     uterine polyp removal  2023    Family History  Problem Relation Age of Onset   Arthritis Mother    Hyperlipidemia Mother    Breast cancer Mother    Hyperlipidemia Father    Hypertension Father    Stroke Father    Throat cancer Father    Alcohol abuse Father    Arthritis Maternal Grandmother    Hearing loss Maternal Grandmother    Arthritis Maternal Grandfather    Heart disease Maternal Grandfather    Hypertension Maternal Grandfather    Social History:  reports that she has never smoked. She has never used smokeless tobacco. She reports that she does not currently use alcohol. She reports that she does not use drugs.  Allergies:  Allergies  Allergen Reactions   Prednisone  Swelling    Facial swelling   Amoxicillin  Other (See Comments)    Causes UTI    Ciprofloxacin Other (See Comments)    Unknown, found from records   Tramadol Nausea Only    No medications prior to admission.    No results found for this or any previous visit (from the past 48 hours). No results found.  Review of Systems  no recent f/c/n/v/wt loss  Height 5' 3 (1.6 m), weight 112 kg, last menstrual period 12/30/2023. Physical Exam  Wn wd woman in nad.  A and O.  EOMI.  Resp unlabored.  L ankle with tender nodule at the achilles tendon proximal from the insertion.   Heelcord is tight.  5/5 strength in PF.  Normal skin.  Palpalble pulses.  Intact sens to LT in the sural n dist.  Assessment/Plan L short achilles and noninsertional tendonitis.  To the OR for gastroc recession and achilles tendon debridement and repair.  The risks and benefits of the alternative treatment options have been discussed in detail.  The patient wishes to proceed with surgery and specifically understands risks of bleeding, infection, nerve damage, blood clots, need for additional surgery, amputation and death.   [name], MD 07/06/2024, 9:31 PM

## 2024-06-21 ENCOUNTER — Ambulatory Visit (HOSPITAL_BASED_OUTPATIENT_CLINIC_OR_DEPARTMENT_OTHER): Admitting: Anesthesiology

## 2024-06-21 ENCOUNTER — Ambulatory Visit (HOSPITAL_BASED_OUTPATIENT_CLINIC_OR_DEPARTMENT_OTHER)
Admission: RE | Admit: 2024-06-21 | Discharge: 2024-06-21 | Disposition: A | Discharge status: Home / Self Care | Attending: Orthopedic Surgery | Admitting: Orthopedic Surgery

## 2024-06-21 ENCOUNTER — Encounter (HOSPITAL_BASED_OUTPATIENT_CLINIC_OR_DEPARTMENT_OTHER): Payer: Self-pay | Admitting: Orthopedic Surgery

## 2024-06-21 ENCOUNTER — Encounter (HOSPITAL_BASED_OUTPATIENT_CLINIC_OR_DEPARTMENT_OTHER)
Admission: RE | Disposition: A | Discharge status: Home / Self Care | Payer: Self-pay | Source: Home / Self Care | Attending: Orthopedic Surgery

## 2024-06-21 ENCOUNTER — Other Ambulatory Visit: Payer: Self-pay

## 2024-06-21 DIAGNOSIS — M7662 Achilles tendinitis, left leg: Secondary | ICD-10-CM | POA: Diagnosis present

## 2024-06-21 DIAGNOSIS — E66813 Obesity, class 3: Secondary | ICD-10-CM

## 2024-06-21 DIAGNOSIS — M7660 Achilles tendinitis, unspecified leg: Secondary | ICD-10-CM | POA: Diagnosis not present

## 2024-06-21 DIAGNOSIS — Z6841 Body Mass Index (BMI) 40.0 and over, adult: Secondary | ICD-10-CM | POA: Diagnosis not present

## 2024-06-21 DIAGNOSIS — I10 Essential (primary) hypertension: Secondary | ICD-10-CM

## 2024-06-21 DIAGNOSIS — M6702 Short Achilles tendon (acquired), left ankle: Secondary | ICD-10-CM | POA: Diagnosis not present

## 2024-06-21 DIAGNOSIS — Z01818 Encounter for other preprocedural examination: Secondary | ICD-10-CM

## 2024-06-21 HISTORY — PX: ACHILLES TENDON SURGERY: SHX542

## 2024-06-21 HISTORY — PX: GASTROCNEMIUS RECESSION: SHX863

## 2024-06-21 LAB — POCT PREGNANCY, URINE: Preg Test, Ur: NEGATIVE

## 2024-06-21 SURGERY — RECESSION, MUSCLE, GASTROCNEMIUS
Anesthesia: General | Site: Leg Lower | Laterality: Left

## 2024-06-21 MED ORDER — HYDROMORPHONE HCL 1 MG/ML IJ SOLN: 0.2500 mg | INTRAMUSCULAR | Status: DC | PRN

## 2024-06-21 MED ORDER — RIVAROXABAN 10 MG PO TABS: 10.0000 mg | ORAL_TABLET | Freq: Every day | ORAL | 0 refills | Status: DC

## 2024-06-21 MED ORDER — KETOROLAC TROMETHAMINE 30 MG/ML IJ SOLN
INTRAMUSCULAR | Status: AC
  Filled 2024-06-21: qty 1

## 2024-06-21 MED ORDER — GLYCOPYRROLATE PF 0.2 MG/ML IJ SOSY
PREFILLED_SYRINGE | INTRAMUSCULAR | Status: AC
  Filled 2024-06-21: qty 1

## 2024-06-21 MED ORDER — BUPIVACAINE HCL (PF) 0.5 % IJ SOLN
INTRAMUSCULAR | Status: DC | PRN
  Administered 2024-06-21: 20 mL

## 2024-06-21 MED ORDER — GLYCOPYRROLATE 0.2 MG/ML IJ SOLN
INTRAMUSCULAR | Status: DC | PRN
  Administered 2024-06-21: .2 mg via INTRAVENOUS

## 2024-06-21 MED ORDER — ACETAMINOPHEN 10 MG/ML IV SOLN
INTRAVENOUS | Status: DC | PRN
  Administered 2024-06-21: 1000 mg via INTRAVENOUS

## 2024-06-21 MED ORDER — ONDANSETRON HCL 4 MG/2ML IJ SOLN
INTRAMUSCULAR | Status: DC | PRN
  Administered 2024-06-21: 4 mg via INTRAVENOUS

## 2024-06-21 MED ORDER — MIDAZOLAM HCL 2 MG/2ML IJ SOLN
INTRAMUSCULAR | Status: AC
  Filled 2024-06-21: qty 2

## 2024-06-21 MED ORDER — PHENYLEPHRINE 80 MCG/ML (10ML) SYRINGE FOR IV PUSH (FOR BLOOD PRESSURE SUPPORT)
PREFILLED_SYRINGE | INTRAVENOUS | Status: AC
  Filled 2024-06-21: qty 10

## 2024-06-21 MED ORDER — ROCURONIUM BROMIDE 10 MG/ML (PF) SYRINGE
PREFILLED_SYRINGE | INTRAVENOUS | Status: AC
  Filled 2024-06-21: qty 10

## 2024-06-21 MED ORDER — FENTANYL CITRATE (PF) 100 MCG/2ML IJ SOLN
100.0000 ug | Freq: Once | INTRAMUSCULAR | Status: AC
  Administered 2024-06-21: 100 ug via INTRAVENOUS

## 2024-06-21 MED ORDER — LACTATED RINGERS IV SOLN: INTRAVENOUS | Status: DC | PRN

## 2024-06-21 MED ORDER — SUCCINYLCHOLINE CHLORIDE 200 MG/10ML IV SOSY
PREFILLED_SYRINGE | INTRAVENOUS | Status: AC
  Filled 2024-06-21: qty 10

## 2024-06-21 MED ORDER — LIDOCAINE 2% (20 MG/ML) 5 ML SYRINGE
INTRAMUSCULAR | Status: DC | PRN
  Administered 2024-06-21: 40 mg via INTRAVENOUS

## 2024-06-21 MED ORDER — FENTANYL CITRATE (PF) 100 MCG/2ML IJ SOLN
INTRAMUSCULAR | Status: AC
  Filled 2024-06-21: qty 2

## 2024-06-21 MED ORDER — MIDAZOLAM HCL 2 MG/2ML IJ SOLN
2.0000 mg | Freq: Once | INTRAMUSCULAR | Status: AC
  Administered 2024-06-21: 2 mg via INTRAVENOUS

## 2024-06-21 MED ORDER — OXYCODONE HCL 5 MG PO TABS: 5.0000 mg | ORAL_TABLET | Freq: Once | ORAL | Status: DC | PRN

## 2024-06-21 MED ORDER — OXYCODONE HCL 5 MG/5ML PO SOLN: 5.0000 mg | Freq: Once | ORAL | Status: DC | PRN

## 2024-06-21 MED ORDER — PHENYLEPHRINE HCL (PRESSORS) 10 MG/ML IV SOLN
INTRAVENOUS | Status: DC | PRN
  Administered 2024-06-21 (×2): 80 ug via INTRAVENOUS

## 2024-06-21 MED ORDER — ACETAMINOPHEN 10 MG/ML IV SOLN
INTRAVENOUS | Status: AC
  Filled 2024-06-21: qty 100

## 2024-06-21 MED ORDER — AMISULPRIDE (ANTIEMETIC) 5 MG/2ML IV SOLN: 10.0000 mg | Freq: Once | INTRAVENOUS | Status: DC | PRN

## 2024-06-21 MED ORDER — BUPIVACAINE-EPINEPHRINE (PF) 0.5% -1:200000 IJ SOLN
INTRAMUSCULAR | Status: AC
  Filled 2024-06-21: qty 30

## 2024-06-21 MED ORDER — PROPOFOL 10 MG/ML IV BOLUS
INTRAVENOUS | Status: DC | PRN
  Administered 2024-06-21: 150 mg via INTRAVENOUS

## 2024-06-21 MED ORDER — 0.9 % SODIUM CHLORIDE (POUR BTL) OPTIME
TOPICAL | Status: DC | PRN
  Administered 2024-06-21: 200 mL

## 2024-06-21 MED ORDER — SUCCINYLCHOLINE CHLORIDE 200 MG/10ML IV SOSY
PREFILLED_SYRINGE | INTRAVENOUS | Status: DC | PRN
Start: 2024-06-21 — End: 2024-06-21
  Administered 2024-06-21: 120 mg via INTRAVENOUS

## 2024-06-21 MED ORDER — BUPIVACAINE LIPOSOME 1.3 % IJ SUSP
INTRAMUSCULAR | Status: DC | PRN
  Administered 2024-06-21: 10 mL

## 2024-06-21 MED ORDER — DEXMEDETOMIDINE HCL IN NACL 80 MCG/20ML IV SOLN
INTRAVENOUS | Status: DC | PRN
  Administered 2024-06-21: 8 ug via INTRAVENOUS
  Administered 2024-06-21: 4 ug via INTRAVENOUS

## 2024-06-21 MED ORDER — CEFAZOLIN SODIUM-DEXTROSE 2-4 GM/100ML-% IV SOLN
INTRAVENOUS | Status: AC
  Filled 2024-06-21: qty 100

## 2024-06-21 MED ORDER — LIDOCAINE 2% (20 MG/ML) 5 ML SYRINGE
INTRAMUSCULAR | Status: AC
  Filled 2024-06-21: qty 5

## 2024-06-21 MED ORDER — MEPERIDINE HCL 25 MG/ML IJ SOLN: 6.2500 mg | INTRAMUSCULAR | Status: DC | PRN

## 2024-06-21 MED ORDER — OXYCODONE HCL 5 MG PO TABS: 5.0000 mg | ORAL_TABLET | Freq: Four times a day (QID) | ORAL | 0 refills | Status: AC | PRN

## 2024-06-21 MED ORDER — LACTATED RINGERS IV SOLN
INTRAVENOUS | Status: DC
Start: 2024-06-21 — End: 2024-06-21

## 2024-06-21 MED ORDER — VANCOMYCIN HCL 500 MG IV SOLR
INTRAVENOUS | Status: AC
  Filled 2024-06-21: qty 10

## 2024-06-21 MED ORDER — VANCOMYCIN HCL 500 MG IV SOLR
INTRAVENOUS | Status: DC | PRN
  Administered 2024-06-21: 500 mg via TOPICAL

## 2024-06-21 MED ORDER — PHENYLEPHRINE HCL-NACL 20-0.9 MG/250ML-% IV SOLN
INTRAVENOUS | Status: AC
  Filled 2024-06-21: qty 250

## 2024-06-21 MED ORDER — CEFAZOLIN SODIUM-DEXTROSE 2-4 GM/100ML-% IV SOLN
2.0000 g | INTRAVENOUS | Status: AC
  Administered 2024-06-21: 2 g via INTRAVENOUS

## 2024-06-21 MED ORDER — DEXMEDETOMIDINE HCL IN NACL 80 MCG/20ML IV SOLN
INTRAVENOUS | Status: AC
  Filled 2024-06-21: qty 20

## 2024-06-21 MED ORDER — ONDANSETRON HCL 4 MG/2ML IJ SOLN
INTRAMUSCULAR | Status: AC
  Filled 2024-06-21: qty 2

## 2024-06-21 MED ORDER — SODIUM CHLORIDE 0.9 % IV SOLN: 12.5000 mg | INTRAVENOUS | Status: DC | PRN

## 2024-06-21 MED ORDER — FENTANYL CITRATE (PF) 100 MCG/2ML IJ SOLN
INTRAMUSCULAR | Status: DC | PRN
  Administered 2024-06-21 (×2): 50 ug via INTRAVENOUS

## 2024-06-21 MED ORDER — ROCURONIUM BROMIDE 100 MG/10ML IV SOLN
INTRAVENOUS | Status: DC | PRN
  Administered 2024-06-21: 10 mg via INTRAVENOUS

## 2024-06-21 SURGICAL SUPPLY — 55 items
BLADE AVERAGE 25X9 (BLADE) IMPLANT
BLADE MICRO SAGITTAL (BLADE) IMPLANT
BLADE SURG 15 STRL LF DISP TIS (BLADE) ×4 IMPLANT
BNDG COMPR ESMARK 6X3 LF (GAUZE/BANDAGES/DRESSINGS) ×2 IMPLANT
BNDG ELASTIC 4INX 5YD STR LF (GAUZE/BANDAGES/DRESSINGS) ×2 IMPLANT
BNDG ELASTIC 6INX 5YD STR LF (GAUZE/BANDAGES/DRESSINGS) ×2 IMPLANT
CANISTER SUCT 1200ML W/VALVE (MISCELLANEOUS) ×2 IMPLANT
CHLORAPREP W/TINT 26 (MISCELLANEOUS) ×2 IMPLANT
COVER BACK TABLE 60X90IN (DRAPES) ×2 IMPLANT
CUFF TRNQT CYL 34X4.125X (TOURNIQUET CUFF) IMPLANT
DRAPE EXTREMITY T 121X128X90 (DISPOSABLE) ×2 IMPLANT
DRAPE OEC MINIVIEW 54X84 (DRAPES) IMPLANT
DRAPE U-SHAPE 47X51 STRL (DRAPES) ×2 IMPLANT
DRSG MEPITEL 4X7.2 (GAUZE/BANDAGES/DRESSINGS) ×2 IMPLANT
ELECTRODE REM PT RTRN 9FT ADLT (ELECTROSURGICAL) ×2 IMPLANT
GAUZE PAD ABD 8X10 STRL (GAUZE/BANDAGES/DRESSINGS) ×4 IMPLANT
GAUZE SPONGE 4X4 12PLY STRL (GAUZE/BANDAGES/DRESSINGS) ×2 IMPLANT
GLOVE BIO SURGEON STRL SZ8 (GLOVE) ×2 IMPLANT
GLOVE BIOGEL PI IND STRL 7.0 (GLOVE) IMPLANT
GLOVE BIOGEL PI IND STRL 8 (GLOVE) ×4 IMPLANT
GLOVE ECLIPSE 7.0 STRL STRAW (GLOVE) IMPLANT
GLOVE ECLIPSE 8.0 STRL XLNG CF (GLOVE) ×2 IMPLANT
GOWN STRL REUS W/ TWL LRG LVL3 (GOWN DISPOSABLE) ×2 IMPLANT
GOWN STRL REUS W/ TWL XL LVL3 (GOWN DISPOSABLE) ×4 IMPLANT
NDL HYPO 22X1.5 SAFETY MO (MISCELLANEOUS) IMPLANT
NDL HYPO 25X1 1.5 SAFETY (NEEDLE) IMPLANT
NDL SUT 6 .5 CRC .975X.05 MAYO (NEEDLE) IMPLANT
NEEDLE HYPO 22X1.5 SAFETY MO (MISCELLANEOUS) IMPLANT
NEEDLE HYPO 25X1 1.5 SAFETY (NEEDLE) IMPLANT
NS IRRIG 1000ML POUR BTL (IV SOLUTION) ×2 IMPLANT
PACK BASIN DAY SURGERY FS (CUSTOM PROCEDURE TRAY) ×2 IMPLANT
PAD CAST 4YDX4 CTTN HI CHSV (CAST SUPPLIES) ×2 IMPLANT
PADDING CAST ABS COTTON 4X4 ST (CAST SUPPLIES) IMPLANT
PADDING CAST COTTON 6X4 STRL (CAST SUPPLIES) ×2 IMPLANT
PENCIL SMOKE EVACUATOR (MISCELLANEOUS) ×2 IMPLANT
SANITIZER HAND ALTRA PUMP 550 (MISCELLANEOUS) ×2 IMPLANT
SHEET MEDIUM DRAPE 40X70 STRL (DRAPES) ×2 IMPLANT
SLEEVE SCD COMPRESS KNEE MED (STOCKING) ×2 IMPLANT
SPLINT PLASTER CAST FAST 5X30 (CAST SUPPLIES) ×40 IMPLANT
SPONGE T-LAP 18X18 ~~LOC~~+RFID (SPONGE) ×2 IMPLANT
STOCKINETTE 6 STRL (DRAPES) ×2 IMPLANT
SUCTION TUBE FRAZIER 10FR DISP (SUCTIONS) ×2 IMPLANT
SUT ETHILON 3 0 PS 1 (SUTURE) ×2 IMPLANT
SUT MNCRL AB 3-0 PS2 18 (SUTURE) ×2 IMPLANT
SUT VIC AB 1 CT1 27XBRD ANBCTR (SUTURE) IMPLANT
SUT VIC AB 2-0 SH 27XBRD (SUTURE) IMPLANT
SUT VICRYL 0 SH 27 (SUTURE) ×2 IMPLANT
SUTURE FIBERWR #2 38 T-5 BLUE (SUTURE) IMPLANT
SUTURE TAPE 1.3 FIBERLOP 20 ST (SUTURE) IMPLANT
SYR BULB EAR ULCER 3OZ GRN STR (SYRINGE) ×2 IMPLANT
SYR CONTROL 10ML LL (SYRINGE) IMPLANT
TOWEL GREEN STERILE FF (TOWEL DISPOSABLE) ×4 IMPLANT
TUBE CONNECTING 20X1/4 (TUBING) ×2 IMPLANT
UNDERPAD 30X36 HEAVY ABSORB (UNDERPADS AND DIAPERS) ×2 IMPLANT
YANKAUER SUCT BULB TIP NO VENT (SUCTIONS) IMPLANT

## 2024-06-21 NOTE — Discharge Instructions (Addendum)
 [name], MD [facility]  Please read the following information regarding your care after surgery.  Medications  You only need a prescription for the narcotic pain medicine (ex. oxycodone, Percocet, Norco).  All of the other medicines listed below are available over the counter. x Aleve 2 pills twice a day for the first 3 days after surgery. x acetominophen (Tylenol ) 650 mg every 4-6 hours as you need for minor to moderate pain x oxycodone as prescribed for severe pain  Narcotic pain medicine (ex. oxycodone, Percocet, Vicodin) will cause constipation.  To prevent this problem, take the following medicines while you are taking any pain medicine. x docusate sodium (Colace) 100 mg twice a day x senna (Senokot) 2 tablets twice a day  x To help prevent blood clots, take Xarelto daily for two weeks after surgery. You should also get up every hour while you are awake to move around.    Weight Bearing X Do not bear any weight on the operated leg or foot.  Cast / Splint / Dressing X Keep your splint, cast or dressing clean and dry.  Don't put anything (coat hanger, pencil, etc) down inside of it.  If it gets damp, use a hair dryer on the cool setting to dry it.  If it gets soaked, call the office to schedule an appointment for a cast change.  After your dressing, cast or splint is removed; you may shower, but do not soak or scrub the wound.  Allow the water to run over it, and then gently pat it dry.  Swelling It is normal for you to have swelling where you had surgery.  To reduce swelling and pain, keep your toes above your nose for at least 3 days after surgery.  It may be necessary to keep your foot or leg elevated for several weeks.  If it hurts, it should be elevated.  Follow Up Call my office at [phone] when you are discharged from the hospital or surgery center to schedule an appointment to be seen two weeks after surgery.  Call my office at [phone] if you develop a fever  >101.5 F, nausea, vomiting, bleeding from the surgical site or severe pain.       Post Anesthesia Home Care Instructions  Activity: Get plenty of rest for the remainder of the day. A responsible individual must stay with you for 24 hours following the procedure.  For the next 24 hours, DO NOT: -Drive a car -Advertising copywriter -Drink alcoholic beverages -Take any medication unless instructed by your physician -Make any legal decisions or sign important papers.  Meals: Start with liquid foods such as gelatin or soup. Progress to regular foods as tolerated. Avoid greasy, spicy, heavy foods. If nausea and/or vomiting occur, drink only clear liquids until the nausea and/or vomiting subsides. Call your physician if vomiting continues.  Special Instructions/Symptoms: Your throat may feel dry or sore from the anesthesia or the breathing tube placed in your throat during surgery. If this causes discomfort, gargle with warm salt water. The discomfort should disappear within 24 hours.  Regional Anesthesia Blocks  1. You may not be able to move or feel the blocked extremity after a regional anesthetic block. This may last may last from 3-48 hours after placement, but it will go away. The length of time depends on the medication injected and your individual response to the medication. As the nerves start to wake up, you may experience tingling as the movement and feeling returns to your extremity. If  the numbness and inability to move your extremity has not gone away after 48 hours, please call your surgeon.   2. The extremity that is blocked will need to be protected until the numbness is gone and the strength has returned. Because you cannot feel it, you will need to take extra care to avoid injury. Because it may be weak, you may have difficulty moving it or using it. You may not know what position it is in without looking at it while the block is in effect.  3. For blocks in the legs and feet,  returning to weight bearing and walking needs to be done carefully. You will need to wait until the numbness is entirely gone and the strength has returned. You should be able to move your leg and foot normally before you try and bear weight or walk. You will need someone to be with you when you first try to ensure you do not fall and possibly risk injury.  4. Bruising and tenderness at the needle site are common side effects and will resolve in a few days.  5. Persistent numbness or new problems with movement should be communicated to the surgeon or the [facility] [phone] [facility] [phone]).  Information for Discharge Teaching: EXPAREL (bupivacaine liposome injectable suspension)   Pain relief is important to your recovery. The goal is to control your pain so you can move easier and return to your normal activities as soon as possible after your procedure. Your physician may use several types of medicines to manage pain, swelling, and more.  Your surgeon or anesthesiologist gave you EXPAREL(bupivacaine) to help control your pain after surgery.  EXPAREL is a local anesthetic designed to release slowly over an extended period of time to provide pain relief by numbing the tissue around the surgical site. EXPAREL is designed to release pain medication over time and can control pain for up to 72 hours. Depending on how you respond to EXPAREL, you may require less pain medication during your recovery. EXPAREL can help reduce or eliminate the need for opioids during the first few days after surgery when pain relief is needed the most. EXPAREL is not an opioid and is not addictive. It does not cause sleepiness or sedation.   Important! A teal colored band has been placed on your arm with the date, time and amount of EXPAREL you have received. Please leave this armband in place for the full 96 hours following administration, and then you may remove the band. If  you return to the hospital for any reason within 96 hours following the administration of EXPAREL, the armband provides important information that your health care providers to know, and alerts them that you have received this anesthetic.    Possible side effects of EXPAREL: Temporary loss of sensation or ability to move in the area where medication was injected. Nausea, vomiting, constipation Rarely, numbness and tingling in your mouth or lips, lightheadedness, or anxiety may occur. Call your doctor right away if you think you may be experiencing any of these sensations, or if you have other questions regarding possible side effects.  Follow all other discharge instructions given to you by your surgeon or nurse. Eat a healthy diet and drink plenty of water or other fluids.

## 2024-06-21 NOTE — Anesthesia Procedure Notes (Signed)
 Procedure Name: Intubation Date/Time: 06/21/2024 7:41 AM  Performed by: Denton Niels CROME, CRNAPre-anesthesia Checklist: Patient identified, Emergency Drugs available, Suction available and Patient being monitored Patient Re-evaluated:Patient Re-evaluated prior to induction Oxygen Delivery Method: Circle system utilized Preoxygenation: Pre-oxygenation with 100% oxygen Induction Type: IV induction Ventilation: Mask ventilation without difficulty Laryngoscope Size: Mac and 3 Grade View: Grade II Tube type: Oral Tube size: 7.0 mm Number of attempts: 1 Airway Equipment and Method: Stylet Placement Confirmation: ETT inserted through vocal cords under direct vision, positive ETCO2 and breath sounds checked- equal and bilateral Secured at: 20 cm Tube secured with: Tape Dental Injury: Teeth and Oropharynx as per pre-operative assessment

## 2024-06-21 NOTE — Anesthesia Postprocedure Evaluation (Signed)
 Anesthesia Post Note  Patient: Jill Meyers  Procedure(s) Performed: Left gastroc recession (Left: Leg Lower) Achilles tendon debridement and repair (Left: Foot)     Patient location during evaluation: PACU Anesthesia Type: General Level of consciousness: awake and alert Pain management: pain level controlled Vital Signs Assessment: post-procedure vital signs reviewed and stable Respiratory status: spontaneous breathing, nonlabored ventilation and respiratory function stable Cardiovascular status: blood pressure returned to baseline and stable Postop Assessment: no apparent nausea or vomiting Anesthetic complications: no   No notable events documented.  Last Vitals:  Vitals:   06/21/24 0915 06/21/24 0945  BP:  116/77  Pulse: 79 86  Resp: (!) 21 16  Temp:  (!) 36.2 C  SpO2: 95% 99%    Last Pain:  Vitals:   06/21/24 0945  TempSrc:   PainSc: 0-No pain                 Butler Levander Pinal

## 2024-06-21 NOTE — Progress Notes (Signed)
Assisted Dr. Miller with left, popliteal, ultrasound guided block. Side rails up, monitors on throughout procedure. See vital signs in flow sheet. Tolerated Procedure well. 

## 2024-06-21 NOTE — Interval H&P Note (Signed)
 History and Physical Interval Note:  06/21/2024 7:14 AM  Jill Meyers  has presented today for surgery, with the diagnosis of Noninsertional tendinopathy of left Achilles tendon.  The various methods of treatment have been discussed with the patient and family. After consideration of risks, benefits and other options for treatment, the patient has consented to  Procedure(s): RECESSION, MUSCLE, GASTROCNEMIUS (Left) REPAIR, TENDON, ACHILLES (Left) as a surgical intervention.  The patient's history has been reviewed, patient examined, no change in status, stable for surgery.  I have reviewed the patient's chart and labs.  Questions were answered to the patient's satisfaction.     [name]

## 2024-06-21 NOTE — Anesthesia Procedure Notes (Signed)
 Anesthesia Regional Block: Popliteal block   Pre-Anesthetic Checklist: , timeout performed,  Correct Patient, Correct Site, Correct Laterality,  Correct Procedure, Correct Position, site marked,  Risks and benefits discussed,  Surgical consent,  Pre-op evaluation,  At surgeon's request and post-op pain management  Laterality: Left  Prep: chloraprep       Needles:  Injection technique: Single-shot  Needle Type: Stimiplex     Needle Length: 9cm  Needle Gauge: 21     Additional Needles:   Procedures:,,,, ultrasound used (permanent image in chart),,    Narrative:  Start time: 06/21/2024 6:58 AM End time: 06/21/2024 7:03 AM Injection made incrementally with aspirations every 5 mL.  Performed by: Personally  Anesthesiologist: Cleotilde Butler Dade, MD

## 2024-06-21 NOTE — Anesthesia Preprocedure Evaluation (Signed)
 Anesthesia Evaluation  Patient identified by MRN, date of birth, ID band Patient awake    Reviewed: Allergy  & Precautions, H&P , NPO status , Patient's Chart, lab work & pertinent test results  Airway Mallampati: II  TM Distance: >3 FB Neck ROM: Full    Dental  (+) Dental Advisory Given   Pulmonary neg pulmonary ROS   Pulmonary exam normal breath sounds clear to auscultation       Cardiovascular negative cardio ROS Normal cardiovascular exam Rhythm:Regular Rate:Normal     Neuro/Psych   Anxiety     negative neurological ROS  negative psych ROS   GI/Hepatic Neg liver ROS,GERD  ,,  Endo/Other    Class 3 obesity  Renal/GU negative Renal ROS  negative genitourinary   Musculoskeletal negative musculoskeletal ROS (+)    Abdominal  (+) + obese  Peds negative pediatric ROS (+)  Hematology negative hematology ROS (+)   Anesthesia Other Findings   Reproductive/Obstetrics negative OB ROS                              Anesthesia Physical Anesthesia Plan  ASA: 3  Anesthesia Plan: General   Post-op Pain Management: Regional block* and Minimal or no pain anticipated   Induction: Intravenous  PONV Risk Score and Plan: 3 and Ondansetron , Dexamethasone, Midazolam and Treatment may vary due to age or medical condition  Airway Management Planned: Oral ETT  Additional Equipment:   Intra-op Plan:   Post-operative Plan: Extubation in OR  Informed Consent: I have reviewed the patients History and Physical, chart, labs and discussed the procedure including the risks, benefits and alternatives for the proposed anesthesia with the patient or authorized representative who has indicated his/her understanding and acceptance.     Dental advisory given  Plan Discussed with: CRNA  Anesthesia Plan Comments:         Anesthesia Quick Evaluation

## 2024-06-21 NOTE — Transfer of Care (Signed)
 Immediate Anesthesia Transfer of Care Note  Patient: Jill Meyers  Procedure(s) Performed: Left gastroc recession (Left: Leg Lower) Achilles tendon debridement and repair (Left: Foot)  Patient Location: PACU  Anesthesia Type:GA combined with regional for post-op pain  Level of Consciousness: drowsy, patient cooperative, and responds to stimulation  Airway & Oxygen Therapy: Patient Spontanous Breathing and Patient connected to face mask oxygen  Post-op Assessment: Report given to RN and Post -op Vital signs reviewed and stable  Post vital signs: Reviewed and stable  Last Vitals:  Vitals Value Taken Time  BP 101/54 06/21/24 08:47  Temp 36.3 C 06/21/24 08:46  Pulse 83 06/21/24 08:54  Resp 20 06/21/24 08:54  SpO2 100 % 06/21/24 08:54  Vitals shown include unfiled device data.  Last Pain:  Vitals:   06/21/24 0636  TempSrc: Temporal  PainSc: 0-No pain      Patients Stated Pain Goal: 2 (06/21/24 0636)  Complications: No notable events documented.

## 2024-06-21 NOTE — Op Note (Signed)
 06/21/2024  8:37 AM  PATIENT:  Jill Meyers  53 y.o. female  PRE-OPERATIVE DIAGNOSIS:  1.  Noninsertional tendinopathy of left Achilles tendon      2.  Short left achilles tendon  POST-OPERATIVE DIAGNOSIS:  same  Procedure(s):  Left gastroc recession 2.  Left Achilles tendon debridement and reconstruction  SURGEON:  [name], MD  ASSISTANT: none  ANESTHESIA:   General, regional  EBL:  minimal   TOURNIQUET:   Total Tourniquet Time Documented: Thigh (Left) - 46 minutes Total: Thigh (Left) - 46 minutes  COMPLICATIONS:  None apparent  DISPOSITION:  Extubated, awake and stable to recovery.  INDICATION FOR PROCEDURE: 53 year old female without significant past medical history complains of worsening left posterior ankle pain for more than a year.  She has signs and symptoms of a short Achilles tendon as well as chronic noninsertional Achilles tendinopathy.  She has failed nonoperative treatment and presents today for gastrocnemius recession and Achilles tendon debridement and reconstruction.The risks and benefits of the alternative treatment options have been discussed in detail.  The patient wishes to proceed with surgery and specifically understands risks of bleeding, infection, nerve damage, blood clots, need for additional surgery, amputation and death.   PROCEDURE IN DETAIL: After preoperative consent was obtained and the correct operative site was identified, the patient was brought to the operating room supine on a stretcher.  General anesthesia was induced.  Preoperative antibiotics were administered.  A surgical timeout was taken.  The left lower extremity was exsanguinated and a thigh tourniquet inflated to 350 mmHg.  The patient was then turned into a prone position on the operating table with all bony prominences padded well.  The left lower extremity was prepped and draped in standard sterile fashion.  A longitudinal incision was made over the lip of the gastrocnemius  tendon.  Dissection was carried sharply down through the subcutaneous tissues taking care to protect the sural nerve and lesser saphenous vein.  The superficial fascia was incised.  The gastrocnemius tendon was identified.  It was divided under direct vision in its entirety.  The wound was irrigated and sprinkled with vancomycin powder.  Subcutaneous tissues were closed with Monocryl.  Skin incision was closed with nylon.  Attention was turned to the posterior ankle.  Midline incision was made over the palpable nodule in the Achilles tendon.  Dissection was carried sharply down through the subcutaneous tissues and peritenon.  The tendon was then incised in the midline.  An area of degenerative change was identified.  This was sharply excised.  After complete debridement of all degenerated portions of the tendon tendon was reconstructed with 0 Vicryl.  The wound was irrigated copiously.  Vancomycin powder was sprinkled in the wound.  Peritenon was repaired with inverted simple sutures of 2-0 Vicryl.  The skin incision was closed with horizontal mattress sutures of 3-0 nylon.  Sterile dressings were applied followed by a well-padded short leg splint with the ankle in gravity equinus.  The tourniquet was released after application of dressings.  The patient was awakened from anesthesia and transported to the recovery room in stable condition.   FOLLOW UP PLAN: Nonweightbearing on the left lower extremity.  Follow-up in 2 weeks for suture removal and conversion to a cam boot with 2 heel lifts.  Xarelto for DVT prophylaxis due to history of obesity.

## 2024-06-22 ENCOUNTER — Encounter (HOSPITAL_BASED_OUTPATIENT_CLINIC_OR_DEPARTMENT_OTHER): Payer: Self-pay | Admitting: Orthopedic Surgery

## 2024-06-29 ENCOUNTER — Other Ambulatory Visit (HOSPITAL_COMMUNITY): Payer: Self-pay | Admitting: Student

## 2024-06-29 ENCOUNTER — Ambulatory Visit (HOSPITAL_COMMUNITY)
Admission: RE | Admit: 2024-06-29 | Discharge: 2024-06-29 | Disposition: A | Discharge status: Home / Self Care | Source: Ambulatory Visit | Attending: Vascular Surgery | Admitting: Vascular Surgery

## 2024-06-29 DIAGNOSIS — M79605 Pain in left leg: Secondary | ICD-10-CM | POA: Diagnosis present

## 2024-07-23 ENCOUNTER — Other Ambulatory Visit: Payer: Self-pay | Admitting: Primary Care

## 2024-07-23 DIAGNOSIS — K219 Gastro-esophageal reflux disease without esophagitis: Secondary | ICD-10-CM

## 2024-08-31 ENCOUNTER — Encounter: Payer: Self-pay | Admitting: *Deleted

## 2024-08-31 ENCOUNTER — Ambulatory Visit: Admitting: Primary Care

## 2024-08-31 VITALS — BP 136/80 | HR 102 | Ht 63.0 in

## 2024-08-31 DIAGNOSIS — Z1211 Encounter for screening for malignant neoplasm of colon: Secondary | ICD-10-CM | POA: Diagnosis not present

## 2024-08-31 DIAGNOSIS — K219 Gastro-esophageal reflux disease without esophagitis: Secondary | ICD-10-CM | POA: Diagnosis not present

## 2024-08-31 DIAGNOSIS — R7303 Prediabetes: Secondary | ICD-10-CM

## 2024-08-31 DIAGNOSIS — E66813 Obesity, class 3: Secondary | ICD-10-CM

## 2024-08-31 DIAGNOSIS — R1032 Left lower quadrant pain: Secondary | ICD-10-CM | POA: Diagnosis not present

## 2024-08-31 DIAGNOSIS — Z6841 Body Mass Index (BMI) 40.0 and over, adult: Secondary | ICD-10-CM | POA: Diagnosis not present

## 2024-08-31 DIAGNOSIS — R10A2 Flank pain, left side: Secondary | ICD-10-CM | POA: Diagnosis not present

## 2024-08-31 LAB — POCT GLYCOSYLATED HEMOGLOBIN (HGB A1C): Hemoglobin A1C: 5.8 % — AB (ref 4.0–5.6)

## 2024-08-31 LAB — POC URINALSYSI DIPSTICK (AUTOMATED)
Bilirubin, UA: NEGATIVE
Blood, UA: NEGATIVE
Glucose, UA: NEGATIVE
Ketones, UA: NEGATIVE
Nitrite, UA: NEGATIVE
Protein, UA: POSITIVE — AB
Spec Grav, UA: >=1.03 — AB (ref 1.010–1.025)
Urobilinogen, UA: 0.2 U/dL
pH, UA: 5 (ref 5.0–8.0)

## 2024-08-31 MED ORDER — METFORMIN HCL ER 500 MG PO TB24: 500.0000 mg | ORAL_TABLET | Freq: Every day | ORAL | 0 refills | Status: AC

## 2024-08-31 NOTE — Progress Notes (Signed)
 Subjective:    Patient ID: Jill Meyers, female    DOB: 10-Sep-1971, 53 y.o.   MRN: [id number]  Jill Meyers is a very pleasant 53 y.o. female with a history of GERD, chronic back pain, obesity, chronic joint pain, descending and sigmoid colon diverticulosis who presents today to discuss abdominal pain, body pain, and GERD.  Symptom onset 6 days ago with LLQ abdominal pain. Several days later her pain became intense and also noticed left flank pain. She believes her pain may have been from gas as her pain was worse when she was attempting to have a bowel movement. Her pain has improved so she believes she may have had gas. She denies dysuria, urinary frequency, hematuria, constipation, diarrhea. She notices her pain now, more mild, when leaning forward. She's never completed a colonoscopy. Her last bowel movement was today.  She is managed on famotidine 20 mg BID which was initiated in August for her chronic itching and rash. Her rash has completely resolved. She continues the famotidine which helps some with reflux but she still has outbreak symptoms of esophageal burning. She will sometimes wake during the night with acid in her throat. She was previously managed on omeprazole  40 mg which did well for her symptoms. She has the prescription at home.  She has chronic body pain for the last several months. She suspects her pain is secondary to her weight gain. She's working to improve her diet by eating lean protein, cutting out sweets and junk food.  She hard time controlling her appetite as she is hungry all the time.  She tested negative for autoimmune arthritis in 2023. Her sed rate was high in August 2025 during her full body itching and rash episodes. She's recently checked her blood sugar at got a fasting glucose reading of 161. She does have a history of prediabetes with A1C of 6.2 in August 2025.  Body mass index is 44.01 kg/m.    Wt Readings from Last 3 Encounters:  06/21/24 248 lb 7.3  oz (112.7 kg)  06/08/24 248 lb (112.5 kg)  06/06/24 247 lb (112 kg)     Review of Systems  Constitutional:  Negative for fever.  Gastrointestinal:  Positive for abdominal pain. Negative for constipation, diarrhea, nausea and vomiting.  Genitourinary:  Positive for flank pain. Negative for dysuria, frequency and hematuria.  Musculoskeletal:  Positive for arthralgias.         Past Medical History:  Diagnosis Date   Acute left-sided thoracic back pain 05/13/2021   Allergic rhinitis    Chickenpox    Constipation    Kidney stones    Left upper quadrant abdominal pain 05/13/2021   Panic attacks    Prediabetes    Urinary frequency 02/23/2023   UTI (urinary tract infection)     Social History   Socioeconomic History   Marital status: Married    Spouse name: Not on file   Number of children: Not on file   Years of education: Not on file   Highest education level: Some college, no degree  Occupational History   Not on file  Tobacco Use   Smoking status: Never   Smokeless tobacco: Never  Vaping Use   Vaping status: Never Used  Substance and Sexual Activity   Alcohol use: Not Currently   Drug use: Never   Sexual activity: Not on file  Other Topics Concern   Not on file  Social History Narrative   Married.   Works as a Scientist, Clinical (histocompatibility And Immunogenetics).  Social Drivers of Health   Tobacco Use: Low Risk (08/31/2024)   Patient History    Smoking Tobacco Use: Never    Smokeless Tobacco Use: Never    Passive Exposure: Not on file  Financial Resource Strain: Low Risk (08/31/2024)   Overall Financial Resource Strain (CARDIA)    Difficulty of Paying Living Expenses: Not hard at all  Food Insecurity: No Food Insecurity (08/31/2024)   Epic    Worried About Radiation Protection Practitioner of Food in the Last Year: Never true    Ran Out of Food in the Last Year: Never true  Transportation Needs: No Transportation Needs (08/31/2024)   Epic    Lack of Transportation (Medical): No    Lack of  Transportation (Non-Medical): No  Physical Activity: Inactive (08/31/2024)   Exercise Vital Sign    Days of Exercise per Week: 0 days    Minutes of Exercise per Session: Not on file  Stress: No Stress Concern Present (08/31/2024)   [employer] of Occupational Health - Occupational Stress Questionnaire    Feeling of Stress: Only a little  Social Connections: Socially Integrated (08/31/2024)   Social Connection and Isolation Panel    Frequency of Communication with Friends and Family: Twice a week    Frequency of Social Gatherings with Friends and Family: Once a week    Attends Religious Services: More than 4 times per year    Active Member of Clubs or Organizations: Yes    Attends Banker Meetings: 1 to 4 times per year    Marital Status: Married  Catering Manager Violence: Not on file  Depression (PHQ2-9): Low Risk (08/31/2024)   Depression (PHQ2-9)    PHQ-2 Score: 0  Alcohol Screen: Low Risk (10/16/2023)   Alcohol Screen    Last Alcohol Screening Score (AUDIT): 1  Housing: Unknown (08/31/2024)   Epic    Unable to Pay for Housing in the Last Year: No    Number of Times Moved in the Last Year: Not on file    Homeless in the Last Year: No  Utilities: Not on file  Health Literacy: Not on file    Past Surgical History:  Procedure Laterality Date   ACHILLES TENDON SURGERY Left 06/21/2024   Procedure: Achilles tendon debridement and repair;  Surgeon: Kit Rush, MD;  Location: [facility];  Service: Orthopedics;  Laterality: Left;   CHOLECYSTECTOMY  1996   GASTROCNEMIUS RECESSION Left 06/21/2024   Procedure: Left gastroc recession;  Surgeon: Kit Rush, MD;  Location: [facility];  Service: Orthopedics;  Laterality: Left;   LITHOTRIPSY     uterine polyp removal  2023    Family History  Problem Relation Age of Onset   Arthritis Mother    Hyperlipidemia Mother    Breast cancer Mother    Hyperlipidemia Father    Hypertension  Father    Stroke Father    Throat cancer Father    Alcohol abuse Father    Arthritis Maternal Grandmother    Hearing loss Maternal Grandmother    Arthritis Maternal Grandfather    Heart disease Maternal Grandfather    Hypertension Maternal Grandfather     Allergies[1]  Medications Ordered Prior to Encounter[2]  BP 136/80   Pulse (!) 102   Ht 5' 3 (1.6 m)   SpO2 96%   BMI 44.01 kg/m  Objective:   Physical Exam Cardiovascular:     Rate and Rhythm: Normal rate and regular rhythm.  Pulmonary:     Effort: Pulmonary  effort is normal.     Breath sounds: Normal breath sounds.  Musculoskeletal:     Cervical back: Neck supple.  Skin:    General: Skin is warm and dry.  Neurological:     Mental Status: She is alert and oriented to person, place, and time.  Psychiatric:        Mood and Affect: Mood normal.     Physical Exam        Assessment & Plan:  Left lower quadrant abdominal pain Assessment & Plan: Differentials include diverticulitis, gas, constipation.   Will obtain CT abdomen pelvis for further evaluation, especially in the setting of left flank pain and history of renal stones. Await results  Orders: -     CT ABDOMEN PELVIS W CONTRAST; Future  Left flank pain Assessment & Plan: Differentials include renal stones, MSK, UTI.  UA today with small leukocytes.  Will add urine culture.   Will obtain CT abdomen/pelvis to evaluate for renal stones and diverticulitis.  Orders placed.     Orders: -     POCT Urinalysis Dipstick (Automated) -     Urine Culture -     CT ABDOMEN PELVIS W CONTRAST; Future  Prediabetes -     POCT glycosylated hemoglobin (Hb A1C) -     metFORMIN HCl ER; Take 1 tablet (500 mg total) by mouth daily with breakfast. For blood sugar  Dispense: 90 tablet; Refill: 0  Screening for colon cancer -     Ambulatory referral to Gastroenterology  Gastroesophageal reflux disease, unspecified whether esophagitis present Assessment &  Plan: Uncontrolled.  Stop famotidine 20 mg BID. Resume omeprazole  40 mg once daily. She experiences breakthrough symptoms with omeprazole  20 mg.   Class 3 severe obesity due to excess calories without serious comorbidity with body mass index (BMI) of 40.0 to 44.9 in adult [facility]) Assessment & Plan: Discussed options. Insurance will not cover Wegovy  and likely not Zepbound.  As she has prediabetes, we will trial Metformin ER 500 mg daily. She agrees.  Discussed potential side effects and do not start until we have completed her CT scan  Follow-up in 3 months     Assessment and Plan Assessment & Plan         Comer MARLA Gaskins, NP       [1]  Allergies Allergen Reactions   Prednisone  Swelling    Facial swelling   Amoxicillin  Other (See Comments)    Causes UTI    Ciprofloxacin Other (See Comments)    Unknown, found from records   Tramadol Nausea Only  [2]  Current Outpatient Medications on File Prior to Visit  Medication Sig Dispense Refill   Cetirizine HCl (ZYRTEC ALLERGY ) 10 MG CAPS Take by oral route.     fexofenadine (ALLEGRA) 60 MG tablet Take 60 mg by mouth 2 (two) times daily.     hydrOXYzine  (ATARAX ) 10 MG tablet Take 1 tablet (10 mg total) by mouth 3 (three) times daily as needed for itching. 30 tablet 0   omeprazole  (PRILOSEC) 40 MG capsule Take 40 mg by mouth daily.     rivaroxaban  (XARELTO ) 10 MG TABS tablet Take 1 tablet (10 mg total) by mouth daily for 14 days. 14 tablet 0   triamcinolone cream (KENALOG) 0.1 % Apply topically.     gabapentin (NEURONTIN) 100 MG capsule Take by mouth.     No current facility-administered medications on file prior to visit.

## 2024-08-31 NOTE — Assessment & Plan Note (Signed)
 Differentials include renal stones, MSK, UTI.  UA today with small leukocytes.  Will add urine culture.   Will obtain CT abdomen/pelvis to evaluate for renal stones and diverticulitis.  Orders placed.

## 2024-08-31 NOTE — Telephone Encounter (Signed)
 Noted

## 2024-08-31 NOTE — Assessment & Plan Note (Signed)
 Discussed options. Insurance will not cover Wegovy  and likely not Zepbound.  As she has prediabetes, we will trial Metformin ER 500 mg daily. She agrees.  Discussed potential side effects and do not start until we have completed her CT scan  Follow-up in 3 months

## 2024-08-31 NOTE — Patient Instructions (Signed)
 Stop taking famotidine for heartburn.  Start taking omeprazole  40 mg once daily with a meal for heartburn.  You will be contacted regarding the CT scan.  Start metformin ER 500 mg once daily with breakfast once you heal from your abdominal symptoms.  Please schedule a physical to meet with me in 3 months.   It was a pleasure to see you today!

## 2024-08-31 NOTE — Assessment & Plan Note (Addendum)
 Differentials include diverticulitis, gas, constipation.   Will obtain CT abdomen pelvis for further evaluation, especially in the setting of left flank pain and history of renal stones. Await results

## 2024-08-31 NOTE — Addendum Note (Signed)
 Addended by: Alysen Smylie K on: 08/31/2024 04:24 PM   Modules accepted: Orders

## 2024-08-31 NOTE — Assessment & Plan Note (Signed)
 Uncontrolled.  Stop famotidine 20 mg BID. Resume omeprazole  40 mg once daily. She experiences breakthrough symptoms with omeprazole  20 mg.

## 2024-09-01 LAB — URINE CULTURE
MICRO NUMBER:: 17349596
Result:: NO GROWTH
SPECIMEN QUALITY:: ADEQUATE

## 2024-09-02 ENCOUNTER — Ambulatory Visit: Payer: Self-pay | Admitting: Primary Care

## 2024-09-06 ENCOUNTER — Inpatient Hospital Stay: Admission: RE | Admit: 2024-09-06

## 2024-09-06 DIAGNOSIS — R1032 Left lower quadrant pain: Secondary | ICD-10-CM

## 2024-09-06 MED ORDER — IOPAMIDOL (ISOVUE-300) INJECTION 61%
100.0000 mL | Freq: Once | INTRAVENOUS | Status: AC | PRN
  Administered 2024-09-06: 12:00:00 100 mL via INTRAVENOUS

## 2024-09-07 NOTE — Telephone Encounter (Signed)
 Copied from CRM #[id number]. Topic: Clinical - Lab/Test Results >> Sep 06, 2024  4:05 PM Alfonso ORN wrote: Reason for CRM: pt called back regarding ct scan. Relayed message. Pt said pcp never and scan never stated that and she will contact Comer herself. Pt experiencing pain on left side.

## 2024-10-19 ENCOUNTER — Ambulatory Visit: Admitting: Primary Care

## 2024-10-19 VITALS — BP 120/78 | HR 103 | Temp 98.0°F | Ht 63.0 in | Wt 259.8 lb

## 2024-10-19 DIAGNOSIS — S60352A Superficial foreign body of left thumb, initial encounter: Secondary | ICD-10-CM | POA: Diagnosis not present

## 2024-10-19 NOTE — Assessment & Plan Note (Signed)
 No visualized foreign body with transillumination or magnifying glass. No evidence of infection.   Resume soaks with soapy water.  We discussed obtaining an ultrasound vs hand specialist.   Referral placed for hand specialist.

## 2024-10-19 NOTE — Patient Instructions (Signed)
 You will either be contacted via phone regarding your referral to the hand specialist, or you may receive a letter on your MyChart portal from our referral team with instructions for scheduling an appointment. Please let us  know if you have not been contacted by anyone within two weeks.  Soak your thumb in soapy water.  Keep your thumb covered when active.  It was a pleasure to see you today!

## 2024-10-19 NOTE — Progress Notes (Signed)
 "  Subjective:    Patient ID: Jill Meyers, female    DOB: 11-Jul-1971, 54 y.o.   MRN: [id number]  Jill Meyers is a very pleasant 54 y.o. female with a history of chronic neck pain, chronic back pain, chronic knee pain who presents today to discuss thumb pain.   Six days ago she was putting up a tent and brushed her thumb against the pole which had fiberglass. Since then she's experiencing pain with certain movements.   She's been soaking her thumb in salt water, keeping the thumb covered without improvement. She's attempted to pick out the glass with a needle and was unsuccessful. She continues to notice the pain.   She denies erythema, warmth, swelling, numbness/tinging.  Wt Readings from Last 3 Encounters:  10/19/24 259 lb 12.8 oz (117.8 kg)  06/21/24 248 lb 7.3 oz (112.7 kg)  06/08/24 248 lb (112.5 kg)   BP Readings from Last 3 Encounters:  10/19/24 120/78  08/31/24 136/80  06/21/24 116/77     Review of Systems  Skin:  Negative for color change.       Left palmer thumb pain  Neurological:  Negative for numbness.         Past Medical History:  Diagnosis Date   Acute left-sided thoracic back pain 05/13/2021   Allergic rhinitis    Chickenpox    Constipation    Kidney stones    Left upper quadrant abdominal pain 05/13/2021   Panic attacks    Prediabetes    Urinary frequency 02/23/2023   UTI (urinary tract infection)     Social History   Socioeconomic History   Marital status: Married    Spouse name: Not on file   Number of children: Not on file   Years of education: Not on file   Highest education level: Some college, no degree  Occupational History   Not on file  Tobacco Use   Smoking status: Never   Smokeless tobacco: Never  Vaping Use   Vaping status: Never Used  Substance and Sexual Activity   Alcohol use: Not Currently   Drug use: Never   Sexual activity: Not on file  Other Topics Concern   Not on file  Social History Narrative   Married.    Works as a Scientist, Clinical (histocompatibility And Immunogenetics).    Social Drivers of Health   Tobacco Use: Low Risk (08/31/2024)   Patient History    Smoking Tobacco Use: Never    Smokeless Tobacco Use: Never    Passive Exposure: Not on file  Financial Resource Strain: Low Risk (08/31/2024)   Overall Financial Resource Strain (CARDIA)    Difficulty of Paying Living Expenses: Not hard at all  Food Insecurity: No Food Insecurity (08/31/2024)   Epic    Worried About Radiation Protection Practitioner of Food in the Last Year: Never true    Ran Out of Food in the Last Year: Never true  Transportation Needs: No Transportation Needs (08/31/2024)   Epic    Lack of Transportation (Medical): No    Lack of Transportation (Non-Medical): No  Physical Activity: Inactive (08/31/2024)   Exercise Vital Sign    Days of Exercise per Week: 0 days    Minutes of Exercise per Session: Not on file  Stress: No Stress Concern Present (08/31/2024)   [employer] of Occupational Health - Occupational Stress Questionnaire    Feeling of Stress: Only a little  Social Connections: Socially Integrated (08/31/2024)   Social Connection and Isolation Panel    Frequency of Communication  with Friends and Family: Twice a week    Frequency of Social Gatherings with Friends and Family: Once a week    Attends Religious Services: More than 4 times per year    Active Member of Clubs or Organizations: Yes    Attends Banker Meetings: 1 to 4 times per year    Marital Status: Married  Catering Manager Violence: Not on file  Depression (PHQ2-9): Low Risk (10/19/2024)   Depression (PHQ2-9)    PHQ-2 Score: 4  Alcohol Screen: Low Risk (10/16/2023)   Alcohol Screen    Last Alcohol Screening Score (AUDIT): 1  Housing: Unknown (08/31/2024)   Epic    Unable to Pay for Housing in the Last Year: No    Number of Times Moved in the Last Year: Not on file    Homeless in the Last Year: No  Utilities: Not on file  Health Literacy: Not on file    Past Surgical  History:  Procedure Laterality Date   ACHILLES TENDON SURGERY Left 06/21/2024   Procedure: Achilles tendon debridement and repair;  Surgeon: Kit Rush, MD;  Location: [facility];  Service: Orthopedics;  Laterality: Left;   CHOLECYSTECTOMY  1996   GASTROCNEMIUS RECESSION Left 06/21/2024   Procedure: Left gastroc recession;  Surgeon: Kit Rush, MD;  Location: [facility];  Service: Orthopedics;  Laterality: Left;   LITHOTRIPSY     uterine polyp removal  2023    Family History  Problem Relation Age of Onset   Arthritis Mother    Hyperlipidemia Mother    Breast cancer Mother    Hyperlipidemia Father    Hypertension Father    Stroke Father    Throat cancer Father    Alcohol abuse Father    Arthritis Maternal Grandmother    Hearing loss Maternal Grandmother    Arthritis Maternal Grandfather    Heart disease Maternal Grandfather    Hypertension Maternal Grandfather     Allergies[1]  Medications Ordered Prior to Encounter[2]  BP 120/78   Pulse (!) 103   Temp 98 F (36.7 C) (Oral)   Ht 5' 3 (1.6 m)   Wt 259 lb 12.8 oz (117.8 kg)   SpO2 94%   BMI 46.02 kg/m  Objective:   Physical Exam Constitutional:      General: She is not in acute distress. Pulmonary:     Effort: Pulmonary effort is normal.  Skin:    General: Skin is warm and dry.     Comments: 1 mm superficial opening to left palmer thumb without visual foreign body. No erythema, swelling, or drainage.      Physical Exam        Assessment & Plan:  Foreign body of left thumb, initial encounter Assessment & Plan: No visualized foreign body with transillumination or magnifying glass. No evidence of infection.   Resume soaks with soapy water.  We discussed obtaining an ultrasound vs hand specialist.   Referral placed for hand specialist.   Orders: -     Ambulatory referral to Orthopedics    Assessment and Plan Assessment & Plan         Comer MARLA Gaskins,  NP        [1]  Allergies Allergen Reactions   Prednisone  Swelling    Facial swelling   Amoxicillin  Other (See Comments)    Causes UTI    Ciprofloxacin Other (See Comments)    Unknown, found from records   Tramadol Nausea Only  [2]  Current  Outpatient Medications on File Prior to Visit  Medication Sig Dispense Refill   Cetirizine HCl (ZYRTEC ALLERGY ) 10 MG CAPS Take by oral route.     fexofenadine (ALLEGRA) 60 MG tablet Take 60 mg by mouth 2 (two) times daily.     gabapentin (NEURONTIN) 100 MG capsule Take by mouth.     hydrOXYzine  (ATARAX ) 10 MG tablet Take 1 tablet (10 mg total) by mouth 3 (three) times daily as needed for itching. 30 tablet 0   metFORMIN  (GLUCOPHAGE -XR) 500 MG 24 hr tablet Take 1 tablet (500 mg total) by mouth daily with breakfast. For blood sugar 90 tablet 0   triamcinolone cream (KENALOG) 0.1 % Apply topically.     No current facility-administered medications on file prior to visit.   "

## 2024-12-04 ENCOUNTER — Encounter: Admitting: Primary Care
